# Patient Record
Sex: Male | Born: 1975
Health system: Southern US, Community
[De-identification: ages and names within clinical notes are randomized; demographics above are authoritative.]

## PROBLEM LIST (undated history)

## (undated) DIAGNOSIS — R5383 Other fatigue: Secondary | ICD-10-CM

## (undated) DIAGNOSIS — Z8489 Family history of other specified conditions: Secondary | ICD-10-CM

## (undated) DIAGNOSIS — R590 Localized enlarged lymph nodes: Secondary | ICD-10-CM

## (undated) DIAGNOSIS — M791 Myalgia, unspecified site: Secondary | ICD-10-CM

## (undated) DIAGNOSIS — R569 Unspecified convulsions: Secondary | ICD-10-CM

## (undated) DIAGNOSIS — K219 Gastro-esophageal reflux disease without esophagitis: Secondary | ICD-10-CM

## (undated) DIAGNOSIS — R1030 Lower abdominal pain, unspecified: Secondary | ICD-10-CM

## (undated) DIAGNOSIS — L0390 Cellulitis, unspecified: Secondary | ICD-10-CM

## (undated) DIAGNOSIS — K279 Peptic ulcer, site unspecified, unspecified as acute or chronic, without hemorrhage or perforation: Secondary | ICD-10-CM

## (undated) DIAGNOSIS — K649 Unspecified hemorrhoids: Secondary | ICD-10-CM

## (undated) DIAGNOSIS — F79 Unspecified intellectual disabilities: Secondary | ICD-10-CM

## (undated) DIAGNOSIS — F70 Mild intellectual disabilities: Secondary | ICD-10-CM

## (undated) HISTORY — DX: Localized enlarged lymph nodes: R59.0

## (undated) HISTORY — DX: Cellulitis, unspecified: L03.90

## (undated) HISTORY — PX: HERNIA REPAIR: SHX51

## (undated) HISTORY — DX: Unspecified convulsions: R56.9

## (undated) HISTORY — DX: Unspecified hemorrhoids: K64.9

## (undated) HISTORY — DX: Gastro-esophageal reflux disease without esophagitis: K21.9

## (undated) HISTORY — DX: Peptic ulcer, site unspecified, unspecified as acute or chronic, without hemorrhage or perforation: K27.9

## (undated) HISTORY — DX: Myalgia, unspecified site: M79.10

## (undated) HISTORY — DX: Unspecified intellectual disabilities: F79

## (undated) HISTORY — DX: Other fatigue: R53.83

## (undated) HISTORY — DX: Mild intellectual disabilities: F70

## (undated) HISTORY — DX: Lower abdominal pain, unspecified: R10.30

---

## 1998-04-26 ENCOUNTER — Emergency Department (HOSPITAL_COMMUNITY): Admission: EM | Admit: 1998-04-26 | Discharge: 1998-04-26 | Payer: Self-pay | Admitting: *Deleted

## 1998-05-02 ENCOUNTER — Ambulatory Visit (HOSPITAL_COMMUNITY): Admission: RE | Admit: 1998-05-02 | Discharge: 1998-05-02 | Payer: Self-pay | Admitting: Emergency Medicine

## 2000-09-09 ENCOUNTER — Emergency Department (HOSPITAL_COMMUNITY): Admission: EM | Admit: 2000-09-09 | Discharge: 2000-09-09 | Payer: Self-pay | Admitting: Emergency Medicine

## 2001-05-13 ENCOUNTER — Emergency Department (HOSPITAL_COMMUNITY): Admission: EM | Admit: 2001-05-13 | Discharge: 2001-05-13 | Payer: Self-pay | Admitting: Emergency Medicine

## 2001-06-15 ENCOUNTER — Inpatient Hospital Stay (HOSPITAL_COMMUNITY): Admission: EM | Admit: 2001-06-15 | Discharge: 2001-06-22 | Payer: Self-pay

## 2001-06-21 ENCOUNTER — Encounter: Payer: Self-pay | Admitting: Internal Medicine

## 2001-06-29 ENCOUNTER — Encounter: Admission: RE | Admit: 2001-06-29 | Discharge: 2001-06-29 | Payer: Self-pay | Admitting: Internal Medicine

## 2001-10-22 ENCOUNTER — Encounter: Payer: Self-pay | Admitting: Emergency Medicine

## 2001-10-22 ENCOUNTER — Emergency Department (HOSPITAL_COMMUNITY): Admission: EM | Admit: 2001-10-22 | Discharge: 2001-10-22 | Payer: Self-pay | Admitting: Emergency Medicine

## 2002-05-07 ENCOUNTER — Encounter: Admission: RE | Admit: 2002-05-07 | Discharge: 2002-05-07 | Payer: Self-pay | Admitting: Internal Medicine

## 2002-08-02 ENCOUNTER — Emergency Department (HOSPITAL_COMMUNITY): Admission: EM | Admit: 2002-08-02 | Discharge: 2002-08-02 | Payer: Self-pay | Admitting: Emergency Medicine

## 2002-08-17 ENCOUNTER — Encounter: Admission: RE | Admit: 2002-08-17 | Discharge: 2002-08-17 | Payer: Self-pay | Admitting: Internal Medicine

## 2002-08-30 ENCOUNTER — Encounter: Admission: RE | Admit: 2002-08-30 | Discharge: 2002-08-30 | Payer: Self-pay | Admitting: Internal Medicine

## 2002-09-24 ENCOUNTER — Emergency Department (HOSPITAL_COMMUNITY): Admission: EM | Admit: 2002-09-24 | Discharge: 2002-09-25 | Payer: Self-pay | Admitting: Emergency Medicine

## 2003-03-07 ENCOUNTER — Emergency Department (HOSPITAL_COMMUNITY): Admission: EM | Admit: 2003-03-07 | Discharge: 2003-03-07 | Payer: Self-pay | Admitting: Emergency Medicine

## 2003-05-31 ENCOUNTER — Encounter: Admission: RE | Admit: 2003-05-31 | Discharge: 2003-05-31 | Payer: Self-pay | Admitting: Internal Medicine

## 2003-10-10 ENCOUNTER — Encounter: Admission: RE | Admit: 2003-10-10 | Discharge: 2003-10-10 | Payer: Self-pay | Admitting: Internal Medicine

## 2004-10-29 ENCOUNTER — Ambulatory Visit: Payer: Self-pay | Admitting: Internal Medicine

## 2004-11-13 ENCOUNTER — Ambulatory Visit: Payer: Self-pay | Admitting: Internal Medicine

## 2006-06-08 ENCOUNTER — Ambulatory Visit: Payer: Self-pay | Admitting: Internal Medicine

## 2006-06-08 ENCOUNTER — Encounter (INDEPENDENT_AMBULATORY_CARE_PROVIDER_SITE_OTHER): Payer: Self-pay | Admitting: Internal Medicine

## 2006-06-08 LAB — CONVERTED CEMR LAB
Anion Gap: 9
BUN: 11 mg/dL
CO2: 27 meq/L
Glucose, Bld: 92 mg/dL
Potassium: 4 meq/L
TSH: 0.367 microintl units/mL

## 2006-09-05 ENCOUNTER — Ambulatory Visit: Payer: Self-pay | Admitting: Hospitalist

## 2006-10-25 ENCOUNTER — Encounter (INDEPENDENT_AMBULATORY_CARE_PROVIDER_SITE_OTHER): Payer: Self-pay | Admitting: Internal Medicine

## 2006-10-25 DIAGNOSIS — Z8711 Personal history of peptic ulcer disease: Secondary | ICD-10-CM

## 2006-10-25 DIAGNOSIS — K219 Gastro-esophageal reflux disease without esophagitis: Secondary | ICD-10-CM | POA: Insufficient documentation

## 2006-10-25 DIAGNOSIS — K649 Unspecified hemorrhoids: Secondary | ICD-10-CM | POA: Insufficient documentation

## 2006-10-25 DIAGNOSIS — K279 Peptic ulcer, site unspecified, unspecified as acute or chronic, without hemorrhage or perforation: Secondary | ICD-10-CM

## 2006-10-25 DIAGNOSIS — F70 Mild intellectual disabilities: Secondary | ICD-10-CM | POA: Insufficient documentation

## 2006-10-25 HISTORY — DX: Peptic ulcer, site unspecified, unspecified as acute or chronic, without hemorrhage or perforation: K27.9

## 2007-06-17 ENCOUNTER — Ambulatory Visit: Payer: Self-pay | Admitting: Infectious Diseases

## 2007-06-17 ENCOUNTER — Inpatient Hospital Stay (HOSPITAL_COMMUNITY): Admission: EM | Admit: 2007-06-17 | Discharge: 2007-06-20 | Payer: Self-pay | Admitting: Emergency Medicine

## 2007-06-17 ENCOUNTER — Ambulatory Visit: Payer: Self-pay | Admitting: Vascular Surgery

## 2007-06-17 ENCOUNTER — Encounter (INDEPENDENT_AMBULATORY_CARE_PROVIDER_SITE_OTHER): Payer: Self-pay | Admitting: Emergency Medicine

## 2007-06-20 ENCOUNTER — Other Ambulatory Visit: Payer: Self-pay | Admitting: Family Medicine

## 2007-06-20 ENCOUNTER — Encounter: Payer: Self-pay | Admitting: Internal Medicine

## 2007-06-27 ENCOUNTER — Encounter (INDEPENDENT_AMBULATORY_CARE_PROVIDER_SITE_OTHER): Payer: Self-pay | Admitting: Internal Medicine

## 2007-06-27 ENCOUNTER — Ambulatory Visit: Payer: Self-pay | Admitting: Internal Medicine

## 2007-08-29 ENCOUNTER — Ambulatory Visit: Payer: Self-pay | Admitting: Internal Medicine

## 2007-09-28 ENCOUNTER — Ambulatory Visit: Payer: Self-pay | Admitting: Internal Medicine

## 2007-09-29 ENCOUNTER — Ambulatory Visit (HOSPITAL_COMMUNITY): Admission: RE | Admit: 2007-09-29 | Discharge: 2007-09-29 | Payer: Self-pay | Admitting: Internal Medicine

## 2007-09-29 ENCOUNTER — Ambulatory Visit: Payer: Self-pay | Admitting: Vascular Surgery

## 2007-10-17 ENCOUNTER — Telehealth (INDEPENDENT_AMBULATORY_CARE_PROVIDER_SITE_OTHER): Payer: Self-pay | Admitting: Internal Medicine

## 2007-10-31 ENCOUNTER — Encounter (INDEPENDENT_AMBULATORY_CARE_PROVIDER_SITE_OTHER): Payer: Self-pay | Admitting: Internal Medicine

## 2008-08-02 ENCOUNTER — Ambulatory Visit: Payer: Self-pay | Admitting: Internal Medicine

## 2008-08-12 ENCOUNTER — Encounter (INDEPENDENT_AMBULATORY_CARE_PROVIDER_SITE_OTHER): Payer: Self-pay | Admitting: Emergency Medicine

## 2008-08-12 ENCOUNTER — Ambulatory Visit: Payer: Self-pay | Admitting: Vascular Surgery

## 2008-08-12 ENCOUNTER — Emergency Department (HOSPITAL_COMMUNITY): Admission: EM | Admit: 2008-08-12 | Discharge: 2008-08-12 | Payer: Self-pay | Admitting: Emergency Medicine

## 2008-08-14 ENCOUNTER — Emergency Department (HOSPITAL_COMMUNITY): Admission: EM | Admit: 2008-08-14 | Discharge: 2008-08-14 | Payer: Self-pay | Admitting: Emergency Medicine

## 2008-08-16 ENCOUNTER — Emergency Department (HOSPITAL_COMMUNITY): Admission: EM | Admit: 2008-08-16 | Discharge: 2008-08-16 | Payer: Self-pay | Admitting: Emergency Medicine

## 2008-08-20 ENCOUNTER — Encounter (INDEPENDENT_AMBULATORY_CARE_PROVIDER_SITE_OTHER): Payer: Self-pay | Admitting: Internal Medicine

## 2008-08-20 ENCOUNTER — Inpatient Hospital Stay (HOSPITAL_COMMUNITY): Admission: AD | Admit: 2008-08-20 | Discharge: 2008-08-23 | Payer: Self-pay | Admitting: Internal Medicine

## 2008-08-20 ENCOUNTER — Ambulatory Visit: Payer: Self-pay | Admitting: Internal Medicine

## 2009-04-11 ENCOUNTER — Emergency Department (HOSPITAL_COMMUNITY): Admission: EM | Admit: 2009-04-11 | Discharge: 2009-04-11 | Payer: Self-pay | Admitting: Emergency Medicine

## 2009-04-12 ENCOUNTER — Emergency Department (HOSPITAL_COMMUNITY): Admission: EM | Admit: 2009-04-12 | Discharge: 2009-04-12 | Payer: Self-pay | Admitting: Emergency Medicine

## 2009-04-15 ENCOUNTER — Emergency Department (HOSPITAL_COMMUNITY): Admission: EM | Admit: 2009-04-15 | Discharge: 2009-04-16 | Payer: Self-pay | Admitting: Emergency Medicine

## 2009-04-17 ENCOUNTER — Encounter (INDEPENDENT_AMBULATORY_CARE_PROVIDER_SITE_OTHER): Payer: Self-pay | Admitting: Internal Medicine

## 2009-04-17 ENCOUNTER — Ambulatory Visit: Payer: Self-pay | Admitting: Internal Medicine

## 2009-04-18 ENCOUNTER — Encounter: Payer: Self-pay | Admitting: Internal Medicine

## 2009-04-28 ENCOUNTER — Ambulatory Visit: Payer: Self-pay | Admitting: Internal Medicine

## 2009-09-02 ENCOUNTER — Ambulatory Visit: Payer: Self-pay | Admitting: Internal Medicine

## 2010-06-18 DIAGNOSIS — R5383 Other fatigue: Secondary | ICD-10-CM

## 2010-06-18 HISTORY — DX: Other fatigue: R53.83

## 2010-07-03 ENCOUNTER — Ambulatory Visit: Payer: Self-pay | Admitting: Internal Medicine

## 2010-07-03 ENCOUNTER — Encounter: Payer: Self-pay | Admitting: Internal Medicine

## 2010-07-03 DIAGNOSIS — R5383 Other fatigue: Secondary | ICD-10-CM

## 2010-07-03 DIAGNOSIS — R569 Unspecified convulsions: Secondary | ICD-10-CM

## 2010-07-03 DIAGNOSIS — R1031 Right lower quadrant pain: Secondary | ICD-10-CM

## 2010-07-03 DIAGNOSIS — R5381 Other malaise: Secondary | ICD-10-CM

## 2010-07-08 ENCOUNTER — Encounter: Payer: Self-pay | Admitting: Internal Medicine

## 2010-07-08 ENCOUNTER — Ambulatory Visit: Payer: Self-pay | Admitting: Internal Medicine

## 2010-07-08 LAB — CONVERTED CEMR LAB
Albumin: 4.5 g/dL
BUN: 9 mg/dL
CO2: 25 meq/L
Creatinine, Ser: 0.89 mg/dL
Glucose, Bld: 89 mg/dL
HCT: 44.3 %
HDL: 67 mg/dL
LDL Cholesterol: 100 mg/dL
MCV: 86.7 fL
Monocytes Absolute: 0.4 10*3/uL
Monocytes Relative: 13 %
Platelets: 252 10*3/uL
Potassium: 4 meq/L
RDW: 13.4 %
TSH: 0.443 microintl units/mL
VLDL: 10 mg/dL
WBC: 3.3 10*3/uL

## 2010-07-10 ENCOUNTER — Encounter: Payer: Self-pay | Admitting: Internal Medicine

## 2010-07-17 ENCOUNTER — Ambulatory Visit: Payer: Self-pay | Admitting: Internal Medicine

## 2010-07-20 LAB — CONVERTED CEMR LAB
ALT: 15 units/L (ref 0–53)
Alkaline Phosphatase: 37 units/L — ABNORMAL LOW (ref 39–117)
CO2: 25 meq/L (ref 19–32)
Calcium: 9 mg/dL (ref 8.4–10.5)
Chloride: 103 meq/L (ref 96–112)
Cholesterol: 177 mg/dL (ref 0–200)
Eosinophils Absolute: 0.2 10*3/uL (ref 0.0–0.7)
HCT: 44.3 % (ref 39.0–52.0)
HDL: 67 mg/dL (ref >39)
Hemoglobin: 14.1 g/dL (ref 13.0–17.0)
LDL Cholesterol: 100 mg/dL — ABNORMAL HIGH (ref 0–99)
Lymphocytes Relative: 21 % (ref 12–46)
Lymphs Abs: 0.7 10*3/uL (ref 0.7–4.0)
MCV: 86.7 fL (ref >=78.0)
Monocytes Absolute: 0.4 10*3/uL (ref 0.1–1.0)
Neutro Abs: 2 10*3/uL (ref 1.7–7.7)
Platelets: 252 10*3/uL (ref 150–400)
Sodium: 138 meq/L (ref 135–145)
TSH: 0.443 microintl units/mL (ref 0.350–4.5)
Total CHOL/HDL Ratio: 2.6
VLDL: 10 mg/dL (ref 0–40)

## 2010-07-21 ENCOUNTER — Telehealth: Payer: Self-pay | Admitting: *Deleted

## 2010-11-19 NOTE — Assessment & Plan Note (Signed)
Summary: Daytime sleepiness, groin pain, [employer] paperwork   Vital Signs:  Patient profile:   35 year old male Height:      67 inches (170.18 cm) Weight:      140.0 pounds (63.64 kg) BMI:     22.01 Temp:     97.3 degrees F (36.28 degrees C) oral Pulse rate:   60 / minute BP sitting:   134 / 89  (left arm) Cuff size:   regular  Vitals Entered By: Theotis Barrio NT II (July 03, 2010 10:38 AM) CC: MOTHER WANTS  HIM TESTED  FOR DM  /  GROIN PAIN /  SLEEP A LOT Pain Assessment Patient in pain? yes     Location: GROIN Intensity: 9 Onset of pain  ON / OFF FOR YEARS   Nutritional Status BMI of 19 -24 = normal  Have you ever been in a relationship where you felt threatened, hurt or afraid?No   Does patient need assistance? Functional Status Self care Ambulation Normal Comments SLEEP A LOT / MOM WANTS HIM CHEKCKED FOR DM  / FLU SHOT   Primary Care Provider:  Deatra Robinson MD  CC:  MOTHER WANTS  HIM TESTED  FOR DM  /  GROIN PAIN /  SLEEP A LOT.  History of Present Illness: Pt is a 35 yo male with PMHx of mild mental retardation, GERD, h/o RLE cellulitis who presents to clinic today with multiple medical concerns, and is accompanied by his mother who helps with obtaining history. Concerns as follows:   1) Groin pain - intermittent in nature, happening every couple of days, for the past "several years". Described as a sharp pain, that occurs mostly at night when pt lays down to go to bed. In prior workup has had inguinal lymphadenopathy noted since 2009, with aspiration culture taken from a right groin lymph node during hospitalization 06/2008 that was negative. Mom confirms hx of inguinal hernias surgically corrected at age 65 years old. No prior sexual encounters, no penile lesions, no dysuria, no trauma, no rashes.   2) Increased sleepiness at least over for past 3 weeks, "maybe longer" - Mom indicates that Arber is taking daily nap and is at times falling asleep at  church. He states he is sleeping well 4 nights a week, and the other 3 nights is awakening after 1 hour of sleep, unknown precipitant.  Nightly schedule described as showes, eats, watches tv, then goes to bed. Denies recent illness, no recent sick contacts, no snoring.  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 He feels he is strong enough and without physical limitation to participate again this year. Vision and hearing are without problem, per pt.        Preventive Screening-Counseling & Management  Alcohol-Tobacco     Smoking Status: never  Caffeine-Diet-Exercise     Does Patient Exercise: yes     Type of exercise: BASKET BALL / SOFT BALL  Current Medications (verified): 1)  None  Allergies: No Known Drug Allergies  Family History: - 1 brother, 2 sisters - 1/2 brother (same father as pt) who died of complications from sickle cell anemia  last year at age 18. MGM - DM, ESRD requiring Mother - sleep apnea  Review of Systems       Per HPI  Physical Exam  General:  Well-developed,well-nourished,in no acute distress; alert,appropriate and cooperative throughout examination Head:  Normocephalic and atraumatic without obvious abnormalities. No apparent alopecia or balding. Eyes:  No corneal or conjunctival inflammation noted. EOMI. Perrla. Vision grossly normal. Mouth:  Oral mucosa and oropharynx without lesions or exudates.  Teeth in good repair. Neck:  No deformities, masses, or tenderness noted. Lungs:  Normal respiratory effort, chest expands symmetrically. Lungs are clear to auscultation, no crackles or wheezes. Heart:  Normal rate and regular rhythm. S1 and S2 normal without gallop, murmur, click, rub or other extra sounds. Abdomen:  Bowel sounds positive,abdomen soft and non-tender without masses, organomegaly or hernias noted. Genitalia:  circumcised,  no varicocele, no scrotal masses, no testicular masses or atrophy, no cutaneous lesions, and no urethral discharge.  No inguinal hernias appreciated.  Msk:  normal ROM, no joint tenderness, and no joint swelling.   Pulses:  R dorsalis pedis normal and L dorsalis pedis normal.   Neurologic:  alert & oriented X3, cranial nerves II-XII intact, and strength normal in all extremities.   Inguinal Nodes:  BL shotty inguinal lymphadenopathy. All palpated lymph nodes less than 1 cm.   Impression & Recommendations:  Problem # 1:  FATIGUE, ACUTE (ICD-780.79) Increased daytime sleepiness may be resultant of viral illness (given acute onset), versus poor sleep hygiene, versus metabolic disturbance such as thyroid disease, versus anemia. Sleep apnea less likely given slender body habitus, no sleeping, sleep disturbance only occuring intermittently. Does not endorse anxiety or depressive symptoms today. - Will check labs today - including TSH, CMP, CBC - Will discuss sleep hygeine practices with Zain and mother - Will encourage exercise   Future Orders: T-Comprehensive Metabolic Panel [phone]) ... 07/06/2010 T-TSH [id number]) ... 07/06/2010  Problem # 2:  GROIN PAIN (ICD-789.09) Ongoing for past "several years", intermittent in nature. Etiology unclear at this time.  - No evidence of infection at this time (no discharge, lesions, rash, skin infection)  - Shotty BL inguinal lymphadenopathy appreciated on exam. During hospitalization in 2009, patient also described this groin pain, at which time a R LN aspiration culture was collected and found to be negative - it is not uncommon for shotty lymphadenopathy to be appreciated on an adult man, especially of lean stature. - No evidence of inguinal hernia on exam - also pain is not coming on during episodes of increased intraabdominal pressure such as with bearing down, coughing, or sneezing, as would be expected with an inguinal hernia - Will continue to  follow, with yearly exams to assess if any progression of size of lymph nodes - can consider biopsy in future if progression in size appreciated.   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  - FLP due --> pt will return fasting on 9/19 - Filled out [employer] paperwork --> to be picked up 9/19 - Pt due for Tdap --> plan to administer 9/19   Future Orders: T-Lipid Profile [phone]) ... 07/06/2010  Other Orders: Admin 1st Vaccine ([id number]) Flu Vaccine 67yrs + [phone]) Future Orders: T-CBC w/Diff ([id number]) ... 07/06/2010  Patient Instructions: 1)  Please follow-up with your Primary care provider in 6months, at which time we can reevaluate your sleepiness and groin pain. 2)  We are ordering labs, to be completed on Monday. Please be fasting for these labs (no eating/ drinking after midnight the night before). I will call you if results are abnormal. 3)  I will leave your [employer] paperwork at the front desk  for you to pick up Monday. 4)  Please follow the following: 5)  - Sleep only as much as necessary to feel rested 6)  - Maintain a regular sleep schedule 7)  - Do not force sleep 8)  - Avoid caffeine after lunch 9)  - Adjust bedroom environment - light, noise, temperature 10)  - Deal with concerns or worries before bedtime 11)  - Exercise regularly. Process Orders Check Orders Results:     [facility]: ABN not required for this insurance Order queued for requisitioning for [facility]: July 06, 2010 10:26 AM  Tests Sent for requisitioning (July 06, 2010 11:41 AM):     07/06/2010: [facility] -- T-Comprehensive Metabolic Panel [[id number]] (signed)     07/06/2010: [facility] -- T-CBC w/Diff [[id number]] (signed)     07/06/2010: [facility] -- T-TSH [phone]  (signed)     07/06/2010: [facility] -- T-Lipid Profile [phone] (signed)     Prevention & Chronic Care Immunizations   Influenza vaccine: Fluvax 3+  (07/03/2010)    Tetanus booster: Not documented    Pneumococcal vaccine: Not documented  Other Screening   Smoking status: never  (07/03/2010)  Lipids   Total Cholesterol: Not documented   LDL: Not documented   LDL Direct: Not documented   HDL: Not documented   Triglycerides: Not documented Flu Vaccine Consent Questions     Do you have a history of severe allergic reactions to this vaccine? no    Any prior history of allergic reactions to egg and/or gelatin? no    Do you have a sensitivity to the preservative Thimersol? no    Do you have a past history of Guillan-Barre Syndrome? no    Do you currently have an acute febrile illness? no    Have you ever had a severe reaction to latex? no    Vaccine information given and explained to patient? yes    Are you currently pregnant? no    Lot Number:[id number]   Exp Date:04/17/2011   Manufacturer: [employer]    Site Given  Left Deltoid IM.Cynda Familia [facility])  July 03, 2010 2:47 PM    LDL Direct: Not documented   HDL: Not documented   Triglycerides: Not documented  .opcflu  Appended Document: Daytime sleepiness, groin pain, [employer] paperwork Plan check repeat CBC and free T4 next visit. Johnette Abraham, D.O.

## 2010-11-19 NOTE — Progress Notes (Signed)
----   Converted from flag ---- ---- 07/21/2010 12:07 PM, Johnette Abraham DO wrote: Thank you!! Sincerely, Johnette Abraham, D.O.   ---- 07/21/2010 10:57 AM, Chinita Pester RN wrote: I called and talked to pt.'s mother about his low blood count and Dr. Thad Ranger wants pt.to make an appt. in 4  weeks.  Mother agreed;appt.has been scheduled Nov. 15, 20114 w/Dr.Karimova.  ---- 07/21/2010 8:46 AM, Jeanie Cooks Sturdivant NT II wrote: HI GLENDA ! ME AGAIN CAN YOU TAKE CARE OF THIS FOR ME? THANKS LELA  ---- 07/20/2010 4:52 PM, Johnette Abraham DO wrote: Dorothy Puffer, can you please call Mr. Dimattia (or his mother) and let them know that his lab work looked ok, except a low blood cell count, which seems to be chronic for Mio, with similar lab values from 2002. However, just because he was symptomatic, I would like to see him back in the clinic in 4 weeks for repeat CBC with diff and reevaluation of his inguinal lymphadenopathy and sleepiness.   Thank you!!  Sincerely, Johnette Abraham, D.O. ------------------------------

## 2010-11-19 NOTE — Letter (Signed)
Summary: [facility]  [facility]   Imported By: Margie Billet 07/09/2010 15:45:10  _____________________________________________________________________  External Attachment:    Type:   Image     Comment:   External Document

## 2010-11-19 NOTE — Miscellaneous (Signed)
Summary: Update of Labs  Clinical Lists Changes  Observations: Added new observation of TSH: 0.443 microintl units/mL (07/08/2010 10:03) Added new observation of VLDL: 10 mg/dL (04/54/0981 19:14) Added new observation of LDL: 100 mg/dL (78/29/5621 30:86) Added new observation of HDL: 67 mg/dL (57/84/6962 95:28) Added new observation of TRIGLYC TOT: 48 mg/dL (41/32/4401 02:72) Added new observation of CHOLESTEROL: 177 mg/dL (53/66/4403 47:42) Added new observation of CALCIUM: 9 mg/dL (59/56/3875 64:33) Added new observation of ALBUMIN: 4.5 g/dL (29/51/8841 66:06) Added new observation of PROTEIN, TOT: 7 g/dL (30/16/0109 32:35) Added new observation of SGPT (ALT): 15 units/L (07/08/2010 10:03) Added new observation of SGOT (AST): 19 units/L (07/08/2010 10:03) Added new observation of ALK PHOS: 37 units/L (07/08/2010 10:03) Added new observation of CREATININE: 0.89 mg/dL (57/32/2025 42:70) Added new observation of BUN: 9 mg/dL (62/37/6283 15:17) Added new observation of BG RANDOM: 89 mg/dL (61/60/7371 06:26) Added new observation of CO2 PLSM/SER: 25 meq/L (07/08/2010 10:03) Added new observation of CL SERUM: 103 meq/L (07/08/2010 10:03) Added new observation of K SERUM: 4 meq/L (07/08/2010 10:03) Added new observation of NA: 138 meq/L (07/08/2010 10:03) Added new observation of ABSOLUTE BAS: 0 K/uL (07/08/2010 10:03) Added new observation of BASOPHIL %: 0 % (07/08/2010 10:03) Added new observation of EOS ABSLT: 0.2 K/uL (07/08/2010 10:03) Added new observation of % EOS AUTO: 5 % (07/08/2010 10:03) Added new observation of ABSOLUTE MON: 0.4 K/uL (07/08/2010 10:03) Added new observation of MONOCYTE %: 13 % (07/08/2010 10:03) Added new observation of ABS LYMPHOCY: 0.7 K/uL (07/08/2010 10:03) Added new observation of LYMPHS %: 21 % (07/08/2010 10:03) Added new observation of PLATELETK/UL: 252 K/uL (07/08/2010 10:03) Added new observation of RDW: 13.4 % (07/08/2010 10:03) Added new  observation of MCHC RBC: 31.8 g/dL (94/85/4627 03:50) Added new observation of MCV: 86.7 fL (07/08/2010 10:03) Added new observation of HCT: 44.3 % (07/08/2010 10:03) Added new observation of HGB: 14.1 g/dL (09/38/1829 93:71) Added new observation of RBC M/UL: 5.1 M/uL (07/08/2010 10:03) Added new observation of WBC COUNT: 3.3 10*3/microliter (07/08/2010 10:03)

## 2010-11-19 NOTE — Assessment & Plan Note (Signed)
Summary: Flu injection    Immunizations Administered:  Tetanus Vaccine:    Vaccine Type: Tdap    Site: right deltoid    Mfr: GlaxoSmithKline    Dose: 0.5 ml    Route: IM    Given by: Angelina Ok RN    Exp. Date: 07/08/2012    Lot #: [id number]    VIS given: 09/04/08 version given July 17, 2010. Given 07/09/2011

## 2011-02-02 ENCOUNTER — Encounter: Payer: Self-pay | Admitting: Internal Medicine

## 2011-03-02 NOTE — Op Note (Signed)
Robert Merritt, Robert Merritt               ACCOUNT NO.:  [id number]   MEDICAL RECORD NO.:  [id number]          PATIENT TYPE:  INP   LOCATION:  5532                         FACILITY:  [facility]   PHYSICIAN:  Almedia Balls. Ranell Patrick, M.D. DATE OF BIRTH:  Jun 15, 1976   DATE OF PROCEDURE:  08/20/2008  DATE OF DISCHARGE:                               OPERATIVE REPORT   PREOPERATIVE DIAGNOSES:  1. Right leg abscess.  2. Right foot abscess.   POSTOPERATIVE DIAGNOSES:  1. Right leg abscess.  2. Right foot abscess.   PROCEDURES PERFORMED:  1. Incision and drainage, right foot.  2. Incision and drainage, right leg with intraoperative cultures from      both locations.   ATTENDING SURGEON:  Almedia Balls. Ranell Patrick, MD   ASSISTANT:  None.   General anesthesia was used.   ESTIMATED BLOOD LOSS:  Minimal.   TOURNIQUET TIME:  30 minutes with 300 mL of mercury.   INSTRUMENT COUNT:  Correct.   COMPLICATIONS:  None.   Preoperative antibiotics were given.  The patient is on preoperative  vancomycin.   INDICATIONS:  The patient is a 35 year old male with a history of right  leg infection dating back to 2008.  The patient has reported last  several days increasing swelling and pain, and a draining wound from his  right calf and foot.  The patient has been seen by the emergency  department on two separate occasions prior to this admission, the  patient is now admitted for progressive cellulitis and potential for  abscess.  Orthopedics is consulted to tentatively treat the abscess.  I  discussed with the patient and his family that there is clearly an  abscess on physical examination and the patient does need operative  drainage and this was agreed too and consent was signed.   DESCRIPTION OF SURGERY:  After adequate level of anesthesia was  achieved, the patient was positioned in supine on the operating room  table.  A bump was placed on the right hip.  A nonsterile tourniquet was  placed on right proximal  thigh.  Right leg sterilely prepped and draped  in usual manner after elevation for 5 minutes.  We elevated the  tourniquet to 300 mmHg.  We then made a longitudinal skin incision  directly overlying a large area of subcutaneous abscess over the dorsal  lateral foot.  Pus exploded out of the wound.  This was flushed under  pressure.  This, what seemed to be relegated to the dorsal aspect of  foot did not seem to go down through the intermetatarsal space.  A sharp  debridement was performed on any nonviable appearing tissue.  This was  about an 8-cm incision that was created over this area.  We sent  intraoperative fluid cultures, aerobic, anaerobic, stat Gram-stain, and  then pulse irrigated with 3 L of pulse irrigation and normal saline  irrigation.  Remainder of the wound looked nice and clean and again  there was no evidence of this progressing any deeper.  It seemed to be  superficial.   Next, we went up to the patient's leg  wound, made a separate  longitudinal incision, and it ellipsed out the patient's prior draining  wound.  This actually opened up and was fairly extensive deeper wound  area.  This was about an 8-cm incision as well, and all nonviable tissue  was removed.  We did send some for tissue culture both aerobic,  anaerobic and Gram stain, and then did a sharp debridement of all  nonviable tissue and pulse irrigation using 3 L of normal saline, pulse  irrigation, remaining wound looked nice and clean.  We packed both  wounds with diluted Betadine, Kerlix gauze and then a sterile  compressive dry dressing followed by a short leg splint.  The patient  was taken to recovery room, extubated having tolerated surgery well.      Almedia Balls. Ranell Patrick, M.D.  Electronically Signed     SRN/MEDQ  D:  08/20/2008  T:  08/21/2008  Job:  [id number]

## 2011-03-02 NOTE — Discharge Summary (Signed)
Robert Merritt, Robert Merritt               ACCOUNT NO.:  [id number]   MEDICAL RECORD NO.:  [id number]          PATIENT TYPE:  INP   LOCATION:  5532                         FACILITY:  [facility]   PHYSICIAN:  Robert Merritt, D.O.  DATE OF BIRTH:  30-Oct-1975   DATE OF ADMISSION:  08/20/2008  DATE OF DISCHARGE:  08/23/2008                               DISCHARGE SUMMARY   PRIORITY DISCHARGE SUMMARY   DISCHARGE DIAGNOSES:  1. Right lower extremity cellulitis and abscess s/p I&D this      hospitalization - likely secondary to contact exposure (mother with      a recent methicillin-resistant Staphylococus aureus infection),      resolving on discharge.  2. Gastroesophageal reflux disease - chronic, controlled with Pepcid      treatment.  3. History of peptic ulcer disease.  4. Mild mental retardation.  5. History of seizure disorder - last seizure in 1995, patient not on      any medications since 1995.   DISCHARGE MEDICATIONS:  1. Pepcid 20 mg tablets, take 1 tablet once daily.  2. Doxycycline 100 mg tablets, take 1 tablet twice daily for 14 days.  3. Percocet 5/325 mg, take 1 tablet every 4 to 6 hours as needed for      pain.  4. Lotrimin cream, apply to area of foot and leg twice a day.   DISPOSITION AND FOLLOWUP:  The patient will follow up in outpatient  clinic with Dr. Aleene Davidson on September 04, 2008.  Please evaluate if  patient has completed course of antibiotics and if cellulitis on the  right leg and foot has resolved.  During the admission and discharge,  there were no electrolyte abnormalities present, and CBC was within  normal limits and no need to check on followup.  Patient will also  follow up with orthopedic surgeon, Dr. Ranell Patrick, in 2 weeks, and we will  call patient when appointment is scheduled.   PROCEDURES PERFORMED:  1. November 3rd:  X-ray of the tibia and fibula showed soft tissue      swelling and superficial ulceration along the lateral aspect of the      lower leg.   No evidence of osteomyelitis or other bone      abnormalities.  2. November 3rd:  X-ray of the foot showed a nondisplaced fracture of      the distal phalanx of the lower toe, no evidence of osteomyelitis.  3. November 3rd:  X-ray of the ankle showed diffuse ankle soft tissue      swelling and no bony abnormalities identified.   CONSULTATIONS:  Almedia Balls. Ranell Patrick, MD, Ortho.   HISTORY OF PRESENT ILLNESS:  Patient is a 35 year old male with past  medical history of seizures, the last episode in 1995, MRSA-positive  cellulitis in right lower extremity in 2008, who presented to [facility] for right leg swelling that started about 15 days ago and has  been getting progressively worse.  He was seen in ED 3 days ago and was  also seen in ED 1 week ago.  He was placed on clindamycin, but  the  treatment did not work for him and now he has 2 prominent abscesses on  lateral aspect of the right foot and ulceration on the right leg.  He  denies systemic symptoms of fever, chills, myalgias, weakness, numbness,  no abdominal or urinary complaints, no nausea, vomiting, or diarrhea.  He denies recent traveling, history of hiking, no trauma, no history of  surgeries of knees.  He does report his mom having an MRSA infection in  the recent month.   VITAL SIGNS ON ADMISSION:  Temperature 98.1, blood pressure 141/95,  pulse 87, respirations 18, saturating 99% on room air.   PHYSICAL EXAMINATION:  The patient has significant right lower extremity  pitting edema, a 3 cm round abscess on lateral aspect of the foot, and  ulceration on lateral aspect of the lower extremity (ulceration midline  between knee and the ankle), associated with erythema, tender to  palpation, warm to touch, some yellow pus drained from right leg, no pus  drainage from right foot abscess.  Strength 5 out of 5 throughout  bilaterally.  No focal neurologic deficits.   LABORATORY DATA:  Sodium 135, potassium 4.1, chloride 100,  bicarb 27,  BUN 9, creatinine 0.87, glucose 91, calcium 9.5.  White blood cells 7.9,  AST 5.8, hemoglobin 13.6, MCV 84, platelets 591.   HOSPITAL COURSE BY PROBLEM:  1. Right lower extremity cellulitis with right foot abscess - this was      likely secondary to exposure (mother with recent MRSA-positive      infection on abdomen).  We collected wound cultures of the right      foot as well as tissue cultures of the right leg, and both were      positive for MRSA.  Patient was placed on contact precaution,      Orthopedics was consulted, and Dr. Ranell Patrick performed an I and D the      same day of admission.  Patient did well postop, he recovered well,      and vancomycin and Unasyn antibiotics were continued.  Patient      responded well with treatment, continued to stay afebrile, and no      leukocytosis on labs.  The patient was discharged on doxycycline      for 14 more days (based on wound cultures, based on sensitivity      report).  Blood cultures were negative x2 and patient was given a      rolling walker on discharge.  2. GERD - patient had some abdominal discomfort during the      hospitalization.  We continued his home medication, Pepcid, and on      discharge he had no abdominal pain.   DISCHARGE VITAL SIGNS AND LABORATORY DATA:  VITAL SIGNS:  Temperature  98.2, pulse 80, respirations 18, blood pressure 127/82, saturating 97%  on room air.  LABS:  Sodium 136, potassium 3.8, chloride 105, bicarb 26, glucose 98,  BUN 8, creatinine 0.84, calcium 8.4.  White blood cells 6.5, hemoglobin  11.9, and platelets 503.   Over 30 minutes spent on discharge.      Robert Sax, MD  Electronically Signed      Robert Merritt, D.O.  Electronically Signed    IM/MEDQ  D:  08/24/2008  T:  08/24/2008  Job:  [id number]   cc:   Almedia Balls. Ranell Patrick, M.D.  Jason Coop, MD

## 2011-03-05 NOTE — Discharge Summary (Signed)
[place]  Patient:    Robert Merritt, Robert Merritt Visit Number: [id number] MRN: [id number]          Service Type: MED Location: [id number] Attending Physician:  Levy Sjogren Dictated by:   Kerrie Pleasure, M.D. Admit Date:  06/15/2001 Discharge Date: 06/22/2001   CC:         Outpatient Clinic   Discharge Summary  DISCHARGE DIAGNOSES: 1. Left lower leg cellulitis. 2. Peptic ulcer disease secondary to Helicobacter pylori.  DISCHARGE MEDICATIONS: 1. Tegretol 300 mg twice daily. 2. Biaxin 500 mg twice a day. 3. Flagyl 500 mg twice a day. 4. Protonix 40 mg twice a day. 5. Keflex 500 mg four times a day for eight days.  DISPOSITION AND FOLLOW-UP:  The patient was discharged in good health and was supposed to follow up with Dr. Mikeal Hawthorne on June 28, 2001 at 3 p.m. in the outpatient clinic.  PROCEDURES PERFORMED:  None.  CONSULTATIONS:  None.  BRIEF ADMISSION HISTORY:  Robert Merritt is a 35 year old African-American male with history of seizure who is on Tegretol since 1996.  He presented to ED with history of stomach pain and nausea and vomiting.  The stomach pain has been going on for several months off and on but has become worse in the last two days.  The patient has had a history of GERD and probable gastritis with ______ and has been taking Naprosyn b.i.d. for the last month or two prior to admission.  He has been taking it for left leg swelling.  He also has had nausea and vomiting which has been going on for the past couple of days.  His last regular meal was the day before admission with no bloody vomiting or melena or hematemesis.  He has past history of left leg swelling but not prior to presentation.  He saw a foot doctor who gave him Naprosyn according to him, which did not help, so he came in.  PAST MEDICAL HISTORY:  Seizure.  He is on Tegretol.  He has a history of inguinal hernia that was repaired when he was still an  infant.  MEDICATIONS: 1. Tegretol 300 mg b.i.d. 2. Naprosyn b.i.d.  ALLERGIES:  No known drug allergies.  FAMILY HISTORY:  His mother is alive with DJD.  His grandmother has diabetes and renal failure.  His father is alive and healthy.  SOCIAL HISTORY:  He lives in [place].  He works at [employer] and [employer].  No history of smoking, drinking, drugs.  He enjoys basketball and soccer apparently.  REVIEW OF SYSTEMS:  Noncontributory.  PHYSICAL EXAMINATION:  VITAL SIGNS:  Temperature 98.8, blood pressure 130/67, pulse 100, respirations 20, and he was saturating 99% on room air.  GENERAL:  He has NAD, pleasant, and cooperative young man.  HEENT:  [place]/AT.  EOMI.  OP clear.  Neck supple.  No icterus.  PERRL.  He did have some tinea on his face with some ______.  CARDIOVASCULAR:  Regular rate and rhythm with a stage 2/6 systolic ejection murmur best heard at the left lower sternal border.  CHEST:  Clear to auscultation.  Good air movement.  ABDOMEN:  Soft and not distended.  He had some diffusely tender points to the umbilicus, ______ the most, as well as left lower quadrant near the scrotum.  GENITOURINARY:  There was no hernia.  RECTAL:  The patient was found to be guaiac positive with normal rectal tone.  EXTREMITIES:  His left lower leg was swollen.  The foot to the mid calf was warm and tender.  The ankle was also swollen and shiny in appearance.  NEUROLOGIC:  His cranial exam was entirely normal on admission.  ADMISSION LABORATORY DATA:  White count 10.1, hemoglobin 14.1, platelets 221. He had a sodium of 135, potassium 3.5, chloride 100, CO2 27, BUN 18.  He had a pH of 7.44 and a PCO2 of 39.6.  His UA showed was positive for ketones, proteins, but negative for nitrite or leukocyte esterase.  He had some rare WBC and RBC.  His liver panel:  Total bilirubin 0.7, alkaline phosphatase 37, AST 29, ALT 19, total protein 7, albumin 3.7, lipase of 15.   PT was 15, INR 1.2, PTT 38.  His Tegretol level was 3.5.  His creatinine was 1 and glucose 105.  He had an acute abdominal series that was normal.  Chest films were also entirely normal.  Abdominal CT was normal.  Blood cultures were ordered. H. pylori test was ordered, which was positive.  Hemoccult stools were positive.  Left extremity Doppler was negative.  HOSPITAL COURSE: #1 - ABDOMINAL PAIN:  The patients H. pylori test came back positive and with occult blood.  The patient was started on therapy for duodenal ulcer, probably secondary to H. pylori.  GI was not consulted and no EGD was performed at this point, but the patient agrees to H. pylori eradication and he was started on PPI plus Protonix 40 mg, Flagyl, and Biaxin.  His abdominal pain did quite well on PPI and antibiotics.  #2 - LOWER EXTREMITY EDEMA:  Based on the swelling, the temperature, and the white count, a presumptive diagnosis of cellulitis was made.  As such, the patient was treated for cellulitis with Keflex.  He was initially started on Ancef, which was later converted to Keflex 500 mg for a total of three weeks treatment.  The patient did very well and he was asked to follow with Dr. Mikeal Hawthorne in the outpatient clinic after completing his therapy.  #3 - HEART MURMUR:  The patient had a grade 2/6 systolic murmur, and for that reason he was referred for an echocardiogram.  His echocardiogram showed an EF of 55-65% with entirely normal ventricular systolic function with no ventricular regional wall motion abnormalities.  No other action was taken about this. Dictated by:   Kerrie Pleasure, M.D. Attending Physician:  Levy Sjogren DD:  08/03/01 TD:  08/05/01 Job: [id number]

## 2011-03-05 NOTE — Discharge Summary (Signed)
Robert Merritt, Robert Merritt               ACCOUNT NO.:  [id number]   MEDICAL RECORD NO.:  [id number]          PATIENT TYPE:  INP   LOCATION:  5738                         FACILITY:  [facility]   PHYSICIAN:  Lollie Sails, MD      DATE OF BIRTH:  03-23-1976   DATE OF ADMISSION:  06/17/2007  DATE OF DISCHARGE:  06/20/2007                               DISCHARGE SUMMARY   DISCHARGE DIAGNOSES:  1. Right lower extremity cellulitis with suspected methicillin-      resistant staph aureus.  2. Mild mental retardation.  3. Gastroesophageal reflux disease.  4. History of peptic ulcer disease and treated for Helicobacter      pylori.  5. History of hemorrhoids.  6. Seizure disorder, diagnosed in 1995.  7. Status post inguinal hernia repair.   DISCHARGE MEDICATIONS:  1. Bactrim DS 1 tab twice daily for 10 days.  2. Vicodin 5/500 every four hours as needed for pain.  3. Pepcid 20 mg p.o. daily.  4. Hibiclens over the counter use as directed.   DISPOSITION/FOLLOW UP:  The patient will follow-up with Dr. Yetta Barre at the outpatient clinic of internal medicine at [facility] on  June 27, 2007 at 3:30 p.m.  He will need his right lower extremity  cellulitis to be followed up clinically.  No labs are needed.   PROCEDURE:  1. Plain film of the right lower extremity tibia and fibula performed      on June 17, 2007 indicating swelling throughout the lower leg.      Underlying bones and joints are unremarkable.  2. Plain film of the right foot performed on June 17, 2007      indicating no evidence of osteomyelitis.  No acute bony or joint      abnormalities.  There is soft tissue swelling noted.   CONSULTING PHYSICIAN:  None.   ADMISSION HISTORY AND PHYSICAL:  This is a 35 year old male with a  history of mild mental retardation, GERD, peptic ulcer disease who  presents with right lower extremity swelling.  The swelling started  yesterday. He apparently bumped his leg on his bed  approximately one  week ago.  The swelling originally began just above the ankle, but has  now spread from the lower knee all the way down to the lower right foot.  The patient is also reporting subjective fevers and nausea and vomiting  x 3 last night.  He has had difficulty standing on his leg secondary to  the pain experienced.  The pain is much worse with moving and with  weightbearing.  There is only mild improvement with Tylenol.  He was  seen originally in the urgent care, who sent him to the [facility]  emergency department for workup of his right lower extremity swelling  and pain.   PHYSICAL EXAMINATION:  ADMITTING VITAL SIGNS:  Temperature of 97.6.  Blood pressure 119/77.  Pulse of 88.  Respirations 16.  Saturating 99%  on room air.  ADMITTING PHYSICAL EXAM:  In general, the patient was in no acute  distress.  EYES:  Pupils are equal,  round and reactive to light bilaterally.  Extraocular eye movements are intact.  The patient's sclerae were  anicteric.  ENT:  He had moist mucous membranes.  No exudates or erythema on  oropharynx.  NECK EXAM:  The patient had a supple neck with no lymphadenopathy.  RESPIRATORY EXAM:  Lungs are clear to auscultation bilaterally.  The  patient had good air movement.  CARDIOVASCULAR EXAM:  Regular rate and rhythm, no murmurs, rubs or  gallops.  Good peripheral pulses.  GI EXAM:  The patient's abdomen was soft, nontender, nondistended with  good bowel sounds.  EXTREMITY EXAM:  The right lower extremity with swelling that extended  from just below the knee to the foot.  The patient had good dorsalis  pedis and posterior tibial pulses in the swollen leg.  No significant  erythema over the area of swelling.  There is a small abrasion just  anterior to the right prepatellar and pretibial area on the right lower  extremity.  SKIN EXAM:  No rashes were apparent.  MUSCLE STRENGTH EXAM:  Full range of motion, +5 muscle strength in all  extremities.   No significant pain in the right leg and right ankle with  passive and active motion within those joints.  NEUROLOGIC EXAM:  Cranial nerves II through XII are intact.  No focal  deficits in terms of sensation.  The patient's gait at this time is  normal.   LABORATORY DATA:  Admitting labs:  Sodium 132, potassium 3.8, chloride  99, bicarb 26, BUN 10, creatinine 1.08, glucose 96, hemoglobin 13.3,  white count 13.1, platelets 223,000, ANC 12.1, MCV 88.9.  Lower  extremities Dopplers were negative for DVT.   HOSPITAL COURSE:  Problem #1 - Right lower extremity swelling:  The  patient was admitted to a regular bed in the inpatient service.  Our  original differential included the possibility of right lower extremity  cellulitis versus DVT.  We ordered a right lower extremity venous  Doppler that was negative for any thrombosis.  We felt that the patient  likely had an acute cellulitis.  We treated him for the possibility of  MRSA with IV vancomycin.  Over the course of the patient's admission,  the patient's white count gradually trended down from 13 to 7.5 and then  4.1.  He remained completely afebrile during the course of his  admission.  The patient's right lower extremity pain continued to cause  him trouble.  He was originally treated with Vicodin, although switched  to Percocet 7.5/325 and experienced slightly better pain control in this  right lower extremity.  Blood cultures were negative x 2 upon discharge.  Wound culture was negative as well.  With im provement in pain control  and with improvement of the patient's level of swelling and erythema and  resolution of the patient's leukocytosis, we felt that we could safely  discharge the patient on p.o. antibiotics.  We gave the patient Bactrim  DS twice daily for the course of 10 days.  He was to follow-up with Dr.  Liliane Channel on September 9th to determine whether he needed any additional  treatments for his right lower extremity  cellulitis.  Problem #2 - GERD:  The patient was not complaining of any significant  epigastric pain following or before meals.  He was asymptomatic during  the course of his admission.  He was maintained on Protonix during his  admission and discharged with Pepcid.  He will need to be followed up  clinically  for this during his outpatient visits in the future.  Problem #3 - Mild mental retardation:  The patient communicates well and  appears to be fairly well-adjusted.  He has a job as a Merchandiser, retail at [employer].  He lives with his mother and receives adequate support from her  as needed for his medical conditions.   LABORATORY DATA:  Discharge labs:  Hemoglobin 11.7, hematocrit 35.2,  white blood cell count 3.1, platelet count 194,000.   VITAL SIGNS:  Discharge vitals:  Temperature 98.8.  Blood pressure 114  to 140 systolic, diastolic 68 to 90.  Pulse 76 to 98.  Respirations 20.  O2 saturation 98 to 99% on room air.      Lollie Sails, MD  Electronically Signed     CB/MEDQ  D:  08/01/2007  T:  08/02/2007  Job:  [id number]

## 2011-07-19 LAB — DIFFERENTIAL
Basophils Absolute: 0
Basophils Relative: 0
Basophils Relative: 0
Eosinophils Absolute: 0
Eosinophils Relative: 0
Lymphocytes Relative: 6 — ABNORMAL LOW
Lymphs Abs: 0.5 — ABNORMAL LOW
Monocytes Absolute: 1
Monocytes Absolute: 1.4 — ABNORMAL HIGH
Neutro Abs: 10.8 — ABNORMAL HIGH
Neutro Abs: 7.7
Neutrophils Relative %: 82 — ABNORMAL HIGH
Neutrophils Relative %: 85 — ABNORMAL HIGH

## 2011-07-19 LAB — CBC
HCT: 40.2
MCHC: 32.9
MCV: 84.1
MCV: 85.3
Platelets: 439 — ABNORMAL HIGH
RBC: 4.78
RBC: 5.07
WBC: 12.7 — ABNORMAL HIGH

## 2011-07-19 LAB — POCT I-STAT, CHEM 8
Calcium, Ion: 1.15
Creatinine, Ser: 1.3
Glucose, Bld: 107 — ABNORMAL HIGH
Hemoglobin: 15.6

## 2011-07-19 LAB — BASIC METABOLIC PANEL
BUN: 8
Chloride: 97
Creatinine, Ser: 0.95
GFR calc non Af Amer: >60
Glucose, Bld: 97
Potassium: 3.3 — ABNORMAL LOW

## 2011-07-20 ENCOUNTER — Encounter: Payer: Self-pay | Admitting: Internal Medicine

## 2011-07-20 ENCOUNTER — Ambulatory Visit (INDEPENDENT_AMBULATORY_CARE_PROVIDER_SITE_OTHER): Payer: Self-pay | Admitting: Internal Medicine

## 2011-07-20 VITALS — BP 139/86 | HR 64 | Temp 97.3°F | Ht 69.0 in | Wt 139.0 lb

## 2011-07-20 DIAGNOSIS — Z23 Encounter for immunization: Secondary | ICD-10-CM

## 2011-07-20 LAB — COMPREHENSIVE METABOLIC PANEL
ALT: 16
ALT: 17
AST: 20
Albumin: 3.9
BUN: 11
BUN: 9
Calcium: 9.5
Chloride: 100
Creatinine, Ser: 0.87
Creatinine, Ser: 0.96
GFR calc Af Amer: >60
GFR calc non Af Amer: >60
Glucose, Bld: 91
Potassium: 3.4 — ABNORMAL LOW
Sodium: 136
Sodium: 137
Total Bilirubin: 0.4
Total Bilirubin: 0.8
Total Protein: 8.3

## 2011-07-20 LAB — DIFFERENTIAL
Basophils Relative: 0
Basophils Relative: 0
Eosinophils Absolute: 0.1
Lymphocytes Relative: 11 — ABNORMAL LOW
Lymphocytes Relative: 18
Lymphs Abs: 0.9

## 2011-07-20 LAB — BASIC METABOLIC PANEL
BUN: 8
CO2: 26
Calcium: 8.4
Glucose, Bld: 98
Potassium: 3.8
Sodium: 136

## 2011-07-20 LAB — WOUND CULTURE

## 2011-07-20 LAB — CULTURE, BLOOD (ROUTINE X 2): Culture: NO GROWTH

## 2011-07-20 LAB — ANAEROBIC CULTURE

## 2011-07-20 LAB — CBC
HCT: 36.9 — ABNORMAL LOW
HCT: 39.9
HCT: 41.6
Hemoglobin: 11.9 — ABNORMAL LOW
Hemoglobin: 13.1
Hemoglobin: 13.6
MCHC: 32.1
MCHC: 32.6
MCV: 84
MCV: 84.3
Platelets: 584 — ABNORMAL HIGH
Platelets: 591 — ABNORMAL HIGH
RBC: 4.73
RBC: 4.96
RDW: 12.9
RDW: 13.1

## 2011-07-20 LAB — TISSUE CULTURE

## 2011-07-20 LAB — RAPID URINE DRUG SCREEN, HOSP PERFORMED
Amphetamines: NOT DETECTED
Barbiturates: NOT DETECTED
Benzodiazepines: NOT DETECTED
Tetrahydrocannabinol: NOT DETECTED

## 2011-07-20 LAB — GRAM STAIN

## 2011-07-20 LAB — PROTIME-INR: Prothrombin Time: 14.3

## 2011-07-20 LAB — APTT: aPTT: 37

## 2011-07-20 NOTE — Progress Notes (Signed)
Subjective:    Patient ID: Robert Merritt, male    DOB: January 17, 1976, 35 y.o.   MRN: [id number]  HPI 1. Patient is here for a "flu shot." Reports receiving it annually. No history of complications associated with vaccinations; no allergies to eggs. Denies any other concerns.   Review of Systems  Constitutional: Negative for fever, chills, weight loss, malaise/fatigue and diaphoresis.  HENT: Negative for hearing loss, ear pain, nosebleeds, congestion, sore throat, neck pain, tinnitus and ear discharge.   Eyes: Negative for blurred vision, double vision, photophobia and discharge.  Respiratory: Negative for cough, hemoptysis, sputum production, shortness of breath, wheezing and stridor.   Cardiovascular: Negative for chest pain, palpitations, orthopnea, claudication, leg swelling and PND.  Gastrointestinal: Negative for heartburn, abdominal pain, diarrhea, constipation and blood in stool.  Genitourinary: Negative for dysuria, urgency, frequency, hematuria and flank pain.  Musculoskeletal: Negative for myalgias, back pain, joint pain and falls.  Skin: Negative for itching and rash.  Neurological: Negative for dizziness, tingling, tremors, sensory change, speech change, focal weakness, seizures, loss of consciousness, weakness and headaches.  Hematological: Negative for environmental allergies. Does not bruise/bleed easily.  Psychiatric/Behavioral: Negative for depression, suicidal ideas, hallucinations, memory loss and substance abuse. The patient is not nervous/anxious and does not have insomnia.       Review of Systems  Constitutional: Negative for fever, chills, weight loss, malaise/fatigue and diaphoresis.  HENT: Negative for hearing loss, ear pain, nosebleeds, congestion, sore throat, neck pain, tinnitus and ear discharge.   Eyes: Negative for blurred vision, double vision, photophobia and discharge.  Respiratory: Negative for cough, hemoptysis, sputum production, shortness of breath,  wheezing and stridor.   Cardiovascular: Negative for chest pain, palpitations, orthopnea, claudication, leg swelling and PND.  Gastrointestinal: Negative for heartburn, abdominal pain, diarrhea, constipation and blood in stool.  Genitourinary: Negative for dysuria, urgency, frequency, hematuria and flank pain.  Musculoskeletal: Negative for myalgias, back pain, joint pain and falls.  Skin: Negative for itching and rash.  Neurological: Negative for dizziness, tingling, tremors, sensory change, speech change, focal weakness, seizures, loss of consciousness, weakness and headaches.  Endo/Heme/Allergies: Negative for environmental allergies. Does not bruise/bleed easily.  Psychiatric/Behavioral: Negative for depression, suicidal ideas, hallucinations, memory loss and substance abuse. The patient is not nervous/anxious and does not have insomnia.     Objective:   Physical Exam   Vitals: reviewed General: alert, well-developed, and cooperative to examination.  Head: normocephalic and atraumatic.  Eyes: vision grossly intact, pupils equal, pupils round, pupils reactive to light, no injection and anicteric.  Mouth: pharynx pink and moist, no erythema, and no exudates.  Neck: supple, full ROM, no thyromegaly, no JVD, and no carotid bruits.  Lungs: normal respiratory effort, no accessory muscle use, normal breath sounds, no crackles, and no wheezes. Heart: normal rate, regular rhythm, no murmur, no gallop, and no rub.  Abdomen: soft, non-tender, normal bowel sounds, no distention, no guarding, no rebound tenderness, no hepatomegaly, and no splenomegaly.  Msk: no joint swelling, no joint warmth, and no redness over joints.  Pulses: 2+ DP/PT pulses bilaterally Extremities: No cyanosis, clubbing, edema Neurologic: alert & oriented X3, cranial nerves II-XII intact, strength normal in all extremities, sensation intact to light touch, and gait normal.  Skin: turgor normal and no rashes.  Psych: Oriented  X3, memory intact for recent and remote, normally interactive, good eye contact, not anxious appearing, and not depressed appearing.        Assessment & Plan:  1. Health maintenance. Patient is to  receive a flu vax today. Side-effects reviewed with the patient. Patient is instructed to contact the clinic with any concerns. Safety :seatbelt use in car, avoidance of illicit drugs, smoking, alcohol and violence were discussed. 2. Hx of seizure disorder. No seizure activity since 1996 per patient's report.

## 2011-07-20 NOTE — Patient Instructions (Signed)
Please, call with any questions. Wash your hands after being outside the house. Take one multivitamin daily. Follow up in 6-12 months.

## 2011-07-30 LAB — COMPREHENSIVE METABOLIC PANEL
Albumin: 3.2 — ABNORMAL LOW
BUN: 8
Creatinine, Ser: 0.95
Glucose, Bld: 93
Total Bilirubin: 0.9
Total Protein: 6.3

## 2011-07-30 LAB — CBC
HCT: 37.7 — ABNORMAL LOW
Hemoglobin: 11.7 — ABNORMAL LOW
MCHC: 33.2
MCV: 85.6
Platelets: 173
Platelets: 196
RBC: 4.16 — ABNORMAL LOW
RBC: 4.74
RDW: 13.5
RDW: 13.3
WBC: 3.1 — ABNORMAL LOW

## 2011-07-30 LAB — CULTURE, BLOOD (ROUTINE X 2): Culture: NO GROWTH

## 2011-07-30 LAB — URINE CULTURE

## 2011-07-30 LAB — URINALYSIS, ROUTINE W REFLEX MICROSCOPIC
Hgb urine dipstick: NEGATIVE
Nitrite: NEGATIVE
Specific Gravity, Urine: 1.026
Urobilinogen, UA: 1

## 2011-07-30 LAB — WOUND CULTURE

## 2011-07-30 LAB — BASIC METABOLIC PANEL
BUN: 3 — ABNORMAL LOW
CO2: 26
Calcium: 8.2 — ABNORMAL LOW
Calcium: 8.5
Creatinine, Ser: 1.08
GFR calc Af Amer: >60
GFR calc non Af Amer: >60
Glucose, Bld: 103 — ABNORMAL HIGH

## 2011-07-30 LAB — DIFFERENTIAL
Basophils Absolute: 0
Basophils Relative: 0
Monocytes Relative: 4
Neutro Abs: 12.1 — ABNORMAL HIGH
Neutrophils Relative %: 93 — ABNORMAL HIGH

## 2012-07-31 ENCOUNTER — Ambulatory Visit (INDEPENDENT_AMBULATORY_CARE_PROVIDER_SITE_OTHER): Payer: Medicare Other | Admitting: *Deleted

## 2012-07-31 DIAGNOSIS — Z23 Encounter for immunization: Secondary | ICD-10-CM

## 2012-12-18 ENCOUNTER — Encounter: Payer: Self-pay | Admitting: Internal Medicine

## 2012-12-18 ENCOUNTER — Ambulatory Visit (INDEPENDENT_AMBULATORY_CARE_PROVIDER_SITE_OTHER): Payer: Medicare Other | Admitting: Internal Medicine

## 2012-12-18 VITALS — BP 130/75 | HR 79 | Temp 98.1°F | Ht 69.0 in | Wt 141.6 lb

## 2012-12-18 DIAGNOSIS — K279 Peptic ulcer, site unspecified, unspecified as acute or chronic, without hemorrhage or perforation: Secondary | ICD-10-CM | POA: Diagnosis not present

## 2012-12-18 DIAGNOSIS — Z114 Encounter for screening for human immunodeficiency virus [HIV]: Secondary | ICD-10-CM | POA: Insufficient documentation

## 2012-12-18 DIAGNOSIS — K219 Gastro-esophageal reflux disease without esophagitis: Secondary | ICD-10-CM | POA: Diagnosis not present

## 2012-12-18 DIAGNOSIS — Z136 Encounter for screening for cardiovascular disorders: Secondary | ICD-10-CM | POA: Diagnosis not present

## 2012-12-18 DIAGNOSIS — Z0000 Encounter for general adult medical examination without abnormal findings: Secondary | ICD-10-CM

## 2012-12-18 LAB — LIPID PANEL
Cholesterol: 185 mg/dL (ref 0–200)
HDL: 69 mg/dL (ref >39)
LDL Cholesterol: 107 mg/dL — ABNORMAL HIGH (ref 0–99)
Triglycerides: 43 mg/dL (ref <150)

## 2012-12-18 NOTE — Progress Notes (Signed)
Patient ID: Robert Merritt, male   DOB: 05-31-1976, 37 y.o.   MRN: [id number]  Subjective:   Patient ID: Robert Merritt male   DOB: [birth year] 37 y.o.   MRN: [id number]  HPI: RobertRobert Merritt is a 37 y.o. male w pmh of mental retardation, H. Pylori s/p eradication, GERD, and remote seizure d/o not requiring antiepileptic therapy (last sz 1995) presenting for routine health maintenance exam.  He tells me that he has no complaints today and feels great. He stays active with multiple sports teams and [employer]. He continues to work at [employer], a job which he has had for the past 21 years. He does have a past history of GERD but denies any symptoms of reflux today. He is not on H2 blocker or PPI therapy. He says his energy level is good and overall he is very happy. He continues to live with his mother.  Past Medical History  Diagnosis Date  . GERD (gastroesophageal reflux disease)   . PUD (peptic ulcer disease)     with h/o H.pylori infection  . Hemorrhoid   . Mental retardation, mild (I.Q. 50-70)   . Cellulitis     right leg  . Myalgia     intermittent lower extremity  . Seizures     not on medication. h/o grand mal seizures, last episode in 1995  . Inguinal lymphadenopathy   . Fatigue 06/2010    TSH low normal, Hb wnl, CMP wnl  . Groin pain     Chronic. S/p R LN aspiration culture which was negative in 2009.   No current outpatient prescriptions on file.   No current facility-administered medications for this visit.   Family History  Problem Relation Age of Onset  . Sleep apnea Mother   . Diabetes Maternal Grandmother   . Sickle cell anemia Brother     Half brother died of complications of sickle cell anemia at age 80  . Kidney failure Maternal Grandmother    History   Social History  . Marital Status: Single    Spouse Name: N/A    Number of Children: N/A  . Years of Education: N/A   Occupational History  . works at [employer]    Social History  Main Topics  . Smoking status: Never Smoker   . Smokeless tobacco: None  . Alcohol Use: No  . Drug Use: No  . Sexually Active: None   Other Topics Concern  . None   Social History Narrative   Lives at home with mother.   Mild mental retardation.   A special Olympics Olympian.   Works at [employer]. 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    Denies alcohol, tobacco, or drug use.   Has never been sexually active.   Review of Systems: Constitutional: Denies fever, chills, fatigue.  HEENT: Denies eye pain, ear pain, hearing loss, congestion, sore throat, dysphagia Respiratory: Denies SOB, DOE, cough  Cardiovascular: Denies chest pain, palpitations  Gastrointestinal: Denies nausea, vomiting, abdominal pain, diarrhea, constipation, blood in stool  Genitourinary: Denies dysuria, urgency, frequency, hematuria Musculoskeletal: denies joint pain or muscle pain Skin: Denies  rash Neurological: Denies dizziness, seizures, numbness Hematological: Denies adenopathy.  Psychiatric/Behavioral: reports mood is good he is happy  Objective:  Physical Exam: Filed Vitals:   12/18/12 1023  BP: 130/75  Pulse: 79  Temp: 98.1 F (36.7 C)  TempSrc: Oral  Height: 5\' 9"  (1.753 m)  Weight: 141 lb 9.6 oz (64.229 kg)  SpO2:  100%   Constitutional: Vital signs reviewed.  Patient is a well-developed and well-nourished male in no acute distress and cooperative with exam. Alert and oriented x3.  Head: Normocephalic and atraumatic Mouth: no erythema or exudates, MMM Eyes: PERRL, EOMI, conjunctivae normal, No scleral icterus.  Neck: Supple, Trachea midline normal ROM, No JVD, mass, thyromegaly  Cardiovascular: RRR, S1 normal, S2 normal, no MRG, pulses symmetric and intact bilaterally Pulmonary/Chest: CTAB, no wheezes, rales, or rhonchi Abdominal: Soft. Non-tender, non-distended, bowel sounds are normal, no masses, organomegaly, or guarding present.  GU: no CVA tenderness Musculoskeletal: No joint deformities,  erythema, or stiffness, ROM full and no nontender Hematology: no cervical LAD  Neurological: A&O x3, Strength is normal and symmetric bilaterally, cranial nerve II-XII are grossly intact, no focal motor deficit, sensory intact to light touch bilaterally.  Skin: Warm, dry and intact. No rash, cyanosis, or clubbing.  Psychiatric: mood and affect congruent and upbeat.  Assessment & Plan:

## 2012-12-18 NOTE — Assessment & Plan Note (Signed)
Patient is without complaints and history, review of systems, and physical exam unremarkable. He is very active with sports and continues to work at [employer]. No tobacco or substance abuse.  Depression screen negative. Counseled on healthy diet today. - Will check fasting lipid panel today - follow-up in 1 year or sooner if acute need arises

## 2012-12-18 NOTE — Assessment & Plan Note (Signed)
Denies any current signs or symptoms of GERD. No blood loss in stool or symptoms of anemia. Not on any H2 blocker or PPI. Do not see need at this time to start therapy.

## 2012-12-18 NOTE — Patient Instructions (Signed)
You are doing a GREAT job with all of your sports and work! I will check your cholesterol today. If anything looks abnormal, I will call you.  It was great to meet you. Go [employer]!!!  Dr. Heloise Beecham

## 2013-07-12 ENCOUNTER — Ambulatory Visit (INDEPENDENT_AMBULATORY_CARE_PROVIDER_SITE_OTHER): Payer: Medicare Other | Admitting: *Deleted

## 2013-07-12 DIAGNOSIS — Z23 Encounter for immunization: Secondary | ICD-10-CM

## 2014-04-11 ENCOUNTER — Ambulatory Visit (INDEPENDENT_AMBULATORY_CARE_PROVIDER_SITE_OTHER): Payer: Medicare Other | Admitting: Internal Medicine

## 2014-04-11 ENCOUNTER — Other Ambulatory Visit: Payer: Self-pay

## 2014-04-11 ENCOUNTER — Observation Stay (HOSPITAL_COMMUNITY)
Admission: AD | Admit: 2014-04-11 | Discharge: 2014-04-12 | Disposition: A | Discharge status: Home / Self Care | Payer: Medicare Other | Source: Ambulatory Visit | Attending: Internal Medicine | Admitting: Internal Medicine

## 2014-04-11 ENCOUNTER — Encounter (HOSPITAL_COMMUNITY): Payer: Self-pay | Admitting: General Practice

## 2014-04-11 ENCOUNTER — Encounter: Payer: Self-pay | Admitting: Internal Medicine

## 2014-04-11 ENCOUNTER — Observation Stay (HOSPITAL_COMMUNITY): Payer: Medicare Other

## 2014-04-11 VITALS — BP 118/87 | HR 141

## 2014-04-11 DIAGNOSIS — K5289 Other specified noninfective gastroenteritis and colitis: Principal | ICD-10-CM | POA: Insufficient documentation

## 2014-04-11 DIAGNOSIS — R112 Nausea with vomiting, unspecified: Secondary | ICD-10-CM | POA: Diagnosis not present

## 2014-04-11 DIAGNOSIS — K219 Gastro-esophageal reflux disease without esophagitis: Secondary | ICD-10-CM | POA: Diagnosis not present

## 2014-04-11 DIAGNOSIS — R030 Elevated blood-pressure reading, without diagnosis of hypertension: Secondary | ICD-10-CM

## 2014-04-11 DIAGNOSIS — E875 Hyperkalemia: Secondary | ICD-10-CM | POA: Diagnosis not present

## 2014-04-11 DIAGNOSIS — G40909 Epilepsy, unspecified, not intractable, without status epilepticus: Secondary | ICD-10-CM | POA: Insufficient documentation

## 2014-04-11 DIAGNOSIS — M6282 Rhabdomyolysis: Secondary | ICD-10-CM | POA: Diagnosis not present

## 2014-04-11 DIAGNOSIS — N179 Acute kidney failure, unspecified: Secondary | ICD-10-CM | POA: Insufficient documentation

## 2014-04-11 DIAGNOSIS — F79 Unspecified intellectual disabilities: Secondary | ICD-10-CM | POA: Insufficient documentation

## 2014-04-11 DIAGNOSIS — R109 Unspecified abdominal pain: Secondary | ICD-10-CM | POA: Diagnosis not present

## 2014-04-11 DIAGNOSIS — K529 Noninfective gastroenteritis and colitis, unspecified: Secondary | ICD-10-CM | POA: Insufficient documentation

## 2014-04-11 DIAGNOSIS — R569 Unspecified convulsions: Secondary | ICD-10-CM | POA: Diagnosis present

## 2014-04-11 DIAGNOSIS — F70 Mild intellectual disabilities: Secondary | ICD-10-CM | POA: Diagnosis present

## 2014-04-11 HISTORY — DX: Family history of other specified conditions: Z84.89

## 2014-04-11 LAB — LACTIC ACID, PLASMA: Lactic Acid, Venous: 3 mmol/L — ABNORMAL HIGH (ref 0.5–2.2)

## 2014-04-11 LAB — CBC WITH DIFFERENTIAL/PLATELET
Basophils Absolute: 0 10*3/uL (ref 0.0–0.1)
Basophils Relative: 0 % (ref 0–1)
Eosinophils Absolute: 0 10*3/uL (ref 0.0–0.7)
Eosinophils Relative: 0 % (ref 0–5)
HEMATOCRIT: 52.4 % — AB (ref 39.0–52.0)
Hemoglobin: 17.8 g/dL — ABNORMAL HIGH (ref 13.0–17.0)
LYMPHS ABS: 1.3 10*3/uL (ref 0.7–4.0)
LYMPHS PCT: 9 % — AB (ref 12–46)
MCH: 28.8 pg (ref 26.0–34.0)
MCHC: 34 g/dL (ref 30.0–36.0)
MCV: 84.7 fL (ref 78.0–100.0)
Monocytes Absolute: 0.4 10*3/uL (ref 0.1–1.0)
Monocytes Relative: 3 % (ref 3–12)
NEUTROS ABS: 13 10*3/uL — AB (ref 1.7–7.7)
Neutrophils Relative %: 88 % — ABNORMAL HIGH (ref 43–77)
PLATELETS: 349 10*3/uL (ref 150–400)
RBC: 6.19 MIL/uL — AB (ref 4.22–5.81)
RDW: 13 % (ref 11.5–15.5)
WBC: 14.8 10*3/uL — AB (ref 4.0–10.5)

## 2014-04-11 LAB — COMPLETE METABOLIC PANEL WITH GFR
ALBUMIN: 5.5 g/dL — AB (ref 3.5–5.2)
ALT: 22 U/L (ref 0–53)
AST: 32 U/L (ref 0–37)
Alkaline Phosphatase: 48 U/L (ref 39–117)
BUN: 32 mg/dL — ABNORMAL HIGH (ref 6–23)
CALCIUM: 10.5 mg/dL (ref 8.4–10.5)
CHLORIDE: 92 meq/L — AB (ref 96–112)
CO2: 24 mEq/L (ref 19–32)
Creat: 2.91 mg/dL — ABNORMAL HIGH (ref 0.50–1.35)
GFR, Est African American: 30 mL/min — ABNORMAL LOW
GFR, Est Non African American: 26 mL/min — ABNORMAL LOW
Glucose, Bld: 146 mg/dL — ABNORMAL HIGH (ref 70–99)
POTASSIUM: 6.3 meq/L — AB (ref 3.5–5.3)
SODIUM: 137 meq/L (ref 135–145)
TOTAL PROTEIN: 9.9 g/dL — AB (ref 6.0–8.3)
Total Bilirubin: 0.6 mg/dL (ref 0.2–1.2)

## 2014-04-11 LAB — BASIC METABOLIC PANEL
BUN: 32 mg/dL — ABNORMAL HIGH (ref 6–23)
CALCIUM: 8.7 mg/dL (ref 8.4–10.5)
CO2: 21 meq/L (ref 19–32)
Chloride: 96 mEq/L (ref 96–112)
Creatinine, Ser: 2.38 mg/dL — ABNORMAL HIGH (ref 0.50–1.35)
GFR calc Af Amer: 38 mL/min — ABNORMAL LOW (ref >90)
GFR calc non Af Amer: 32 mL/min — ABNORMAL LOW (ref >90)
GLUCOSE: 136 mg/dL — AB (ref 70–99)
Potassium: 5.2 mEq/L (ref 3.7–5.3)
Sodium: 139 mEq/L (ref 137–147)

## 2014-04-11 LAB — URINALYSIS, ROUTINE W REFLEX MICROSCOPIC
Bilirubin Urine: NEGATIVE
Glucose, UA: NEGATIVE mg/dL
Ketones, ur: 15 mg/dL — AB
LEUKOCYTES UA: NEGATIVE
Nitrite: NEGATIVE
PH: 5 (ref 5.0–8.0)
Protein, ur: 100 mg/dL — AB
Specific Gravity, Urine: 1.024 (ref 1.005–1.030)
Urobilinogen, UA: 0.2 mg/dL (ref 0.0–1.0)

## 2014-04-11 LAB — URINE MICROSCOPIC-ADD ON

## 2014-04-11 LAB — TROPONIN I: Troponin I: <0.3 ng/mL (ref <0.30)

## 2014-04-11 LAB — CREATININE, URINE, RANDOM: Creatinine, Urine: 425.59 mg/dL

## 2014-04-11 LAB — GLUCOSE, CAPILLARY: Glucose-Capillary: 157 mg/dL — ABNORMAL HIGH (ref 70–99)

## 2014-04-11 LAB — POTASSIUM
POTASSIUM: 7.2 meq/L — AB (ref 3.7–5.3)
Potassium: 5.1 mEq/L (ref 3.7–5.3)

## 2014-04-11 LAB — LIPASE: Lipase: 16 U/L (ref 0–75)

## 2014-04-11 MED ORDER — SODIUM CHLORIDE 0.9 % IV SOLN: INTRAVENOUS | Status: DC

## 2014-04-11 MED ORDER — SODIUM CHLORIDE 0.9 % IV BOLUS (SEPSIS)
1000.0000 mL | Freq: Once | INTRAVENOUS | Status: AC
  Administered 2014-04-11: 1000 mL via INTRAVENOUS

## 2014-04-11 MED ORDER — ENOXAPARIN SODIUM 40 MG/0.4ML ~~LOC~~ SOLN
40.0000 mg | SUBCUTANEOUS | Status: DC
  Filled 2014-04-11: qty 0.4

## 2014-04-11 MED ORDER — ALBUTEROL SULFATE (2.5 MG/3ML) 0.083% IN NEBU
INHALATION_SOLUTION | RESPIRATORY_TRACT | Status: AC
  Filled 2014-04-11: qty 9

## 2014-04-11 MED ORDER — ALBUTEROL SULFATE (2.5 MG/3ML) 0.083% IN NEBU
10.0000 mg | INHALATION_SOLUTION | Freq: Once | RESPIRATORY_TRACT | Status: AC
  Administered 2014-04-11: 10 mg via RESPIRATORY_TRACT
  Filled 2014-04-11: qty 12

## 2014-04-11 MED ORDER — SODIUM CHLORIDE 0.9 % IV SOLN
INTRAVENOUS | Status: AC
  Administered 2014-04-11 (×2): via INTRAVENOUS

## 2014-04-11 MED ORDER — ONDANSETRON HCL 4 MG/2ML IJ SOLN: 4.0000 mg | Freq: Three times a day (TID) | INTRAMUSCULAR | Status: AC | PRN

## 2014-04-11 MED ORDER — PROMETHAZINE HCL 25 MG/ML IJ SOLN: 12.5000 mg | Freq: Once | INTRAMUSCULAR | Status: DC

## 2014-04-11 MED ORDER — SODIUM POLYSTYRENE SULFONATE 15 GM/60ML PO SUSP
15.0000 g | Freq: Once | ORAL | Status: AC
  Administered 2014-04-11: 15 g via ORAL
  Filled 2014-04-11: qty 60

## 2014-04-11 MED ORDER — MORPHINE SULFATE 2 MG/ML IJ SOLN: 1.0000 mg | INTRAMUSCULAR | Status: DC | PRN

## 2014-04-11 MED ORDER — SODIUM CHLORIDE 0.9 % IV SOLN
1.0000 g | Freq: Once | INTRAVENOUS | Status: AC
  Administered 2014-04-11: 1 g via INTRAVENOUS
  Filled 2014-04-11: qty 10

## 2014-04-11 MED ORDER — DEXTROSE 50 % IV SOLN
INTRAVENOUS | Status: AC
  Filled 2014-04-11: qty 50

## 2014-04-11 MED ORDER — INSULIN ASPART 100 UNIT/ML IV SOLN
10.0000 [IU] | Freq: Once | INTRAVENOUS | Status: AC
  Administered 2014-04-11: 10 [IU] via INTRAVENOUS

## 2014-04-11 MED ORDER — SODIUM BICARBONATE 8.4 % IV SOLN
50.0000 meq | Freq: Once | INTRAVENOUS | Status: AC
  Administered 2014-04-11: 50 meq via INTRAVENOUS
  Filled 2014-04-11: qty 50

## 2014-04-11 MED ORDER — SODIUM CHLORIDE 0.9 % IV BOLUS (SEPSIS): 1000.0000 mL | Freq: Once | INTRAVENOUS | Status: DC

## 2014-04-11 MED ORDER — SODIUM CHLORIDE 0.9 % IV BOLUS (SEPSIS)
1000.0000 mL | INTRAVENOUS | Status: DC
  Administered 2014-04-11: 1000 mL via INTRAVENOUS

## 2014-04-11 MED ORDER — SODIUM CHLORIDE 0.9 % IV BOLUS (SEPSIS): 500.0000 mL | Freq: Once | INTRAVENOUS | Status: DC

## 2014-04-11 MED ORDER — FUROSEMIDE 10 MG/ML IJ SOLN
40.0000 mg | Freq: Once | INTRAMUSCULAR | Status: AC
  Administered 2014-04-11: 40 mg via INTRAVENOUS
  Filled 2014-04-11: qty 4

## 2014-04-11 MED ORDER — DEXTROSE 50 % IV SOLN
1.0000 | Freq: Once | INTRAVENOUS | Status: AC
  Administered 2014-04-11: 50 mL via INTRAVENOUS

## 2014-04-11 MED ORDER — DEXTROSE 50 % IV SOLN
INTRAVENOUS | Status: AC
  Administered 2014-04-11: 50 mL via INTRAVENOUS
  Filled 2014-04-11: qty 50

## 2014-04-11 MED ORDER — HEPARIN SODIUM (PORCINE) 5000 UNIT/ML IJ SOLN
5000.0000 [IU] | Freq: Three times a day (TID) | INTRAMUSCULAR | Status: DC
  Administered 2014-04-11 – 2014-04-12 (×3): 5000 [IU] via SUBCUTANEOUS
  Filled 2014-04-11 (×5): qty 1

## 2014-04-11 MED ORDER — PROMETHAZINE HCL 25 MG/ML IJ SOLN
12.5000 mg | Freq: Once | INTRAMUSCULAR | Status: AC
  Administered 2014-04-11: 12.5 mg via INTRAMUSCULAR

## 2014-04-11 NOTE — Progress Notes (Signed)
I saw patient and discussed his care with Dr. Sherrine MaplesGlenn at the time of the visit.  We reviewed the resident's history and exam and pertinent patient test results.  I agree with the assessment, diagnosis, and plan of care documented in the resident's note, including plan to admit to hospital.

## 2014-04-11 NOTE — Progress Notes (Signed)
NURSING PROGRESS NOTE  Robert BeltonDwight L Merritt [id number] Admission Data: 04/11/2014 7:54 PM Attending Jennalynn Rivard: Aletta EdouardShilpa Bhardwaj, MD WUJ:[id number]:Jones, Jonita AlbeeEden, MD Code Status: Full   Robert BeltonDwight L Merritt is a 38 y.o. male patient admitted from ED:  -No acute distress noted.  -No complaints of shortness of breath.  -No complaints of chest pain.   Cardiac Monitoring: Box # 6 in place. Cardiac monitor yields:normal sinus rhythm.  Blood pressure 138/84, pulse 90, temperature 97.8 F (36.6 C), temperature source Oral, resp. rate 18, height 5\' 7"  (1.702 m), weight 61.372 kg (135 lb 4.8 oz), SpO2 100.00%.   IV Fluids:  IV in place, occlusive dsg intact without redness, IV cath wrist right, condition patent and no redness normal saline.   Allergies:  Review of patient's allergies indicates no known allergies.  Past Medical History:   has a past medical history of GERD (gastroesophageal reflux disease); PUD (peptic ulcer disease); Hemorrhoid; Mental retardation, mild (I.Q. 50-70); Cellulitis; Myalgia; Seizures; Inguinal lymphadenopathy; Fatigue (06/2010); Groin pain; and Family history of anesthesia complication.  Past Surgical History:   has past surgical history that includes Hernia repair.  Social History:   reports that he has never smoked. He has never used smokeless tobacco. He reports that he does not drink alcohol or use illicit drugs.  Skin: Intact  Patient/Family orientated to room. Information packet given to patient/family. Admission inpatient armband information verified with patient/family to include name and date of birth and placed on patient arm. Side rails up x 2, fall assessment and education completed with patient/family. Patient/family able to verbalize understanding of risk associated with falls and verbalized understanding to call for assistance before getting out of bed. Call light within reach. Patient/family able to voice and demonstrate understanding of unit orientation instructions.     Will continue to evaluate and treat per MD orders.

## 2014-04-11 NOTE — Progress Notes (Signed)
Patient ID: Robert Merritt, male   DOB: 08/08/1976, 38 y.o.   MRN: [id number]  Subjective:   Patient ID: Robert Merritt male   DOB: 08/08/1976 38 y.o.   MRN: [id number]  HPI: Robert Merritt is a 38 y.o. M w/ PMH seizure d/o and GERD presents for an acute visit.   He endorses abdominal pain and N/V in addition to headaches, sore throat, cough, fatigue, runny nose, and chills that began yesterday. He was at work at [employer] yesterday and had to leave because he was not feeling well. Per his mother, who is with him today, the patient has been having increased bowel movements over the past few days and began having nausea yesterday and began vomiting overnight. She states that he was vomiting throughout the night last night and is now having dry heaves. He has been unable to keep any food or fluid down. His mother is not sure if he has had any sick contacts at work but no one has been sick at home.    Past Medical History  Diagnosis Date  . GERD (gastroesophageal reflux disease)   . PUD (peptic ulcer disease)     with h/o H.pylori infection  . Hemorrhoid   . Mental retardation, mild (I.Q. 50-70)   . Cellulitis     right leg  . Myalgia     intermittent lower extremity  . Seizures     not on medication. h/o grand mal seizures, last episode in 1995  . Inguinal lymphadenopathy   . Fatigue 06/2010    TSH low normal, Hb wnl, CMP wnl  . Groin pain     Chronic. S/p R LN aspiration culture which was negative in 2009.   No current outpatient prescriptions on file.   No current facility-administered medications for this visit.   Family History  Problem Relation Age of Onset  . Sleep apnea Mother   . Diabetes Maternal Grandmother   . Sickle cell anemia Brother     Half brother died of complications of sickle cell anemia at age 40  . Kidney failure Maternal Grandmother    History   Social History  . Marital Status: Single    Spouse Name: N/A    Number of Children: N/A  . Years of  Education: N/A   Occupational History  . works at [employer]    Social History Main Topics  . Smoking status: Never Smoker   . Smokeless tobacco: None  . Alcohol Use: No  . Drug Use: No  . Sexual Activity: None   Other Topics Concern  . None   Social History Narrative   Lives at home with mother.   Mild mental retardation.   A special Olympics Olympian.   Works at [employer]. 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    Denies alcohol, tobacco, or drug use.   Has never been sexually active.   Review of Systems: Constitutional: Unsure if he has had fevers, +chills and fatigue.  HEENT: +runny nose, nasal congestion Respiratory: +cough Cardiovascular: Denies chest pain or leg swelling.  Gastrointestinal: +nausea, vomiting, abdominal pain, diarrhea Genitourinary: Denies dysuria, urgency, frequency Musculoskeletal: Denies back pain, joint swelling Skin: Denies rash and wound.  Neurological: +dizziness and headaches.  Psychiatric/Behavioral: Denies suicidal ideation, mood changes   Objective:  Physical Exam: Filed Vitals:   04/11/14 0830 04/11/14 0840 04/11/14 0845 04/11/14 0850  BP: 138/89 133/89 142/93 118/87  Pulse: 115 102 111 141  SpO2: 98%  Constitutional: Vital signs reviewed.  Patient is an ill appearing male in mild distress who is actively vomiting. Head: Normocephalic and atraumatic Nose: +mucus present.  Mouth: Thick saliva. Tongue appears dry. Eyes: PERRL, EOMI.  Neck: Supple, Trachea midline Cardiovascular: Tachycardic, regular rhythm Pulmonary/Chest: Normal respiratory effort, CTAB, no wheezes, rales, or rhonchi Abdominal: Soft. Non-distended, Diffusely tender to palpation, w/o guarding or rebound. Bowel sounds are hyperactive.  GU: no CVA tenderness Musculoskeletal: No joint deformities, erythema, or stiffness. Moves all 4 extremities Neurological: A&O x3, no focal deficits.  Skin: Warm, dry and intact.  Psychiatric: Stable mood and affect.   Assessment &  Plan:   Please refer to Problem List based Assessment and Plan

## 2014-04-11 NOTE — H&P (Signed)
Date: 04/11/2014               Patient Name:  Robert Merritt MRN: [id number]  DOB: [birth year] Age / Sex: 38 y.o., male   PCP: Courtney ParisEden W Jones, MD         Medical Service: Internal Medicine Teaching Service         Attending Physician: Dr. Aletta EdouardShilpa Bhardwaj, MD    First Contact: Dr. Wendall StadeE. Emokpae Pager: [phone]  Second Contact: Dr. Jorje GuildS. Kennerly Pager: [phone]       After Hours (After 5p/  First Contact Pager: [phone]  weekends / holidays): Second Contact Pager: [phone]   Chief Complaint: vomiting and abdominal pain  History of Present Illness: Robert Merritt is a 38 yo with history significant for mental retardation, seizure disorder, GERD and prior peptic ulcer disease who presented to [facility] along with his mother with reports of nausea, vomiting, diarrhea, and abdominal pain for the past day.  Patient and mother reports that he was not feeling well overall last week but was without specific complaints other than "laying in the bed longer than usual" and increase bowel movements.  Over the course of the past day, they state that Robert Merritt started vomiting more than 5 times and was "up all night vomiting".  He is complaining of weakness, lightheadedness, sore throat and diffuse abdominal pain which he believes all started at the same time. Denies fever, diaphoresis, chest pain, or shortness of breath but states that he has chills. 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  Meds: Current Facility-Administered Medications  Medication Dose Route Frequency Provider Last Rate Last Dose  . 0.9 %  sodium chloride infusion   Intravenous Continuous Genelle GatherKathryn F Glenn, MD      . 0.9 %  sodium chloride infusion   Intravenous Continuous Manuela SchwartzKaren-Akosua M Zariah Cavendish, MD      . enoxaparin (LOVENOX) injection 40 mg  40 mg Subcutaneous Q24H Manuela SchwartzKaren-Akosua M Jonesha Tsuchiya, MD      . morphine 2 MG/ML injection 1 mg  1 mg Intravenous Q4H PRN Genelle GatherKathryn F Glenn, MD       . ondansetron [facility](ZOFRAN) injection 4 mg  4 mg Intravenous Q8H PRN Genelle GatherKathryn F Glenn, MD      . sodium chloride 0.9 % bolus 1,000 mL  1,000 mL Intravenous Once Genelle GatherKathryn F Glenn, MD      . sodium chloride 0.9 % bolus 1,000 mL  1,000 mL Intravenous STAT Manuela SchwartzKaren-Akosua M Kashawn Dirr, MD        Allergies: Allergies as of 04/11/2014  . (No Known Allergies)   Past Medical History  Diagnosis Date  . GERD (gastroesophageal reflux disease)   . PUD (peptic ulcer disease)     with h/o H.pylori infection  . Hemorrhoid   . Mental retardation, mild (I.Q. 50-70)   . Cellulitis     right leg  . Myalgia     intermittent lower extremity  . Seizures     not on medication. h/o grand mal seizures, last episode in 1995  . Inguinal lymphadenopathy   . Fatigue 06/2010    TSH low normal, Hb wnl, CMP wnl  . Groin pain     Chronic. S/p R LN aspiration culture which was negative in 2009.   No past surgical history on file. Family History  Problem Relation Age of Onset  . Sleep apnea Mother   . Diabetes Maternal Grandmother   .  Sickle cell anemia Brother     Half brother died of complications of sickle cell anemia at age 40  . Kidney failure Maternal Grandmother    History   Social History  . Marital Status: Single    Spouse Name: N/A    Number of Children: N/A  . Years of Education: N/A   Occupational History  . works at [employer]    Social History Main Topics  . Smoking status: Never Smoker   . Smokeless tobacco: Not on file  . Alcohol Use: No  . Drug Use: No  . Sexual Activity: Not on file   Other Topics Concern  . Not on file   Social History Narrative   Lives at home with mother.   Mild mental retardation.   A special Olympics Olympian.   Works at [employer]. 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    Denies alcohol, tobacco, or drug use.   Has never been sexually active.     Review of Systems: Constitutional:  Endorse chills, decreased appetite and fatigue, Denies fever or diaphoresis    HEENT: Endorse sore throat, sinus congestion, post-nasal drip, Denies rhinorrhea, sneezing, mouth sores, trouble swallowing, neck pain, neck stiffness and tinnitus.  Respiratory: Denies SOB, DOE, cough, chest tightness, and wheezing.  Cardiovascular: Denies chest pain, palpitations and leg swelling.  Gastrointestinal: Endorse nausea, vomiting, abdominal pain, diarrhea, Denies constipation, blood in stool and abdominal distention.  Genitourinary: Denies dysuria, hematuria  Musculoskeletal: Denies myalgias, back pain, joint swelling, arthralgias and gait problem.   Skin: Denies rash and wound.  Neurological: Endorses light-headedness, dizziness Denies seizures, syncope, numbness and headaches.   Hematological: Denies easy bruising  Psychiatric/ Behavioral: Denies confusion     Physical Exam: General: Well-developed, well-nourished, AA male, in no acute distress mother at bedside; HEENT: Normocephalic, atraumatic, PERRLA, EOMI, dry mucous membrane, supple neck Lungs: Normal respiratory effort. Clear to auscultation bilaterally from apices to bases without crackles or wheezes appreciated. Heart: slightly tachycardic, regular rhythm, normal S1 and S2, no gallop, murmur, or rubs appreciated. Abdomen: BS normoactive. Soft, Nondistended, mildly tender throughout. No masses or organomegaly appreciated. Extremities: No pretibial edema, distal pulses intact Neurologic: grossly non-focal, alert and oriented x3, appropriate and cooperative throughout examination. Psych: normal mood and affect, converses well with staff   Lab results: Basic Metabolic Panel:  Recent Labs  16/07/9605/25/15 0926  NA 137  K 6.3*  CL 92*  CO2 24  GLUCOSE 146*  BUN 32*  CREATININE 2.91*  CALCIUM 10.5   Liver Function Tests:  Recent Labs  04/11/14 0926  AST 32  ALT 22  ALKPHOS 48  BILITOT 0.6  PROT 9.9*  ALBUMIN 5.5*    Recent Labs  04/11/14 0926  LIPASE 16   CBC:  Recent Labs  04/11/14 0926  WBC  14.8*  NEUTROABS 13.0*  HGB 17.8*  HCT 52.4*  MCV 84.7  PLT 349    Other results: EKG: 98 bpm normal sinus rhythm, diffuse peaked Twaves.  Assessment & Plan by Problem: Robert Merritt is a 38 yo who presented to clinic with nausea, vomiting, diarrhea, and abdominal pain x 1 day admitted with clinical picture worrisome for gastroenteritis vs possible bowel obstruction in setting of volume depletion and hyperkalemia.  #1 Abdominal pain with SIRS: nausea, vomiting, diarrhea and volume depletion with leukocytosis. Likely secondary to gastroenteritis. Possible GI source of infection would qualify as sepsis.  It would be prudent to rule out partial bowel obstruction.  Abdominal Imaging ordered from clinic and  pending at this time. Patient appears comfortable on exam without signs of acute abdomen. -volume resuscitate 1 NS bolus now then 1.5 maintenance fluids @ 150cc/h -follow vitals signs (needs temperature and RR documented) -abd XRay pending -serial abdominal exams -stool studies -Zofran for nausea -NPO for now, advance as tolerated -UA   #2 Hyperkalemia: in setting of Acute Renal Failure secondary to volume depletion, K of 6.3, EKF with peaked T waves, no chest pain, palpitations or shortness of breath.  He remains hemodynamically stable on exam with adequate bp, and oxygenation but is tachycardic. -Hyperkalemia protocol ordered with 1L NS bolus STAT -monitor with telemetry -frequent K monitoring and serial EKG -if repeat level >6.4 consider nebulized albuterol, IV Novolog and dextrose -cycle troponins x3   #3 Acute Renal Failure: likely prerenal secondary to volume depletion from vomiting and diarrhea -aggressive IV fluids -monitor creatinine -check FENa -check urine electrolytes  DVT ppx: Lovenox SQ  FULL CODE  Dispo: Disposition is deferred at this time, awaiting improvement of current medical problems. Anticipated discharge in approximately 1-3 day(s).   The patient does  have a current PCP Courtney Paris, MD) and does need an [facility] hospital follow-up appointment after discharge.  The patient does not have transportation limitations that hinder transportation to clinic appointments.  Signed: Manuela Schwartz, MD 04/11/2014, 12:01 PM

## 2014-04-11 NOTE — Progress Notes (Signed)
CRITICAL VALUE ALERT  Critical value received:  K 7.2  Date of notification:  04/11/14  Time of notification:  1300  Critical value read back:Yes.    Nurse who received alert:  Jacquiline DoeJennifer Zhu, RN  MD notified (1st page): Dr. Bosie ClosSchooler  Time of first page:  1330  Responding MD:  Dr. Bosie ClosSchooler  Time MD responded:  1400

## 2014-04-11 NOTE — Assessment & Plan Note (Addendum)
Pt still vomiting this morning in the clinic and has diffuse abdominal pain w/o rebound or guarding. No sick contacts at home, but he works daily at [employer] and is exposed to hundred of people daily there. He is orthostatic: Lying 138/89, Sitting 142/93, Standing 118/87. No tick exposure, so RMSF, which can present with N/V is unlikely. No recent antibiotic use. Symptoms are likely 2/2 viral gastroenteritis, but will have to r/o other causes. Will start IVF and antiemetics and recheck his blood pressure after fluids. Checking labs, including lipase to r/o pancreatitis.  - Normal saline bolus 1L - IM Phenergan 12.5mg  x1 - CBC, CMP - Upright and supine abdominal films to evaluate for obstruction - Admit to the hospital overnight for observation

## 2014-04-12 ENCOUNTER — Other Ambulatory Visit: Payer: Self-pay

## 2014-04-12 DIAGNOSIS — N179 Acute kidney failure, unspecified: Secondary | ICD-10-CM | POA: Diagnosis not present

## 2014-04-12 DIAGNOSIS — M6282 Rhabdomyolysis: Secondary | ICD-10-CM | POA: Diagnosis not present

## 2014-04-12 DIAGNOSIS — K5289 Other specified noninfective gastroenteritis and colitis: Secondary | ICD-10-CM

## 2014-04-12 DIAGNOSIS — E875 Hyperkalemia: Secondary | ICD-10-CM | POA: Diagnosis not present

## 2014-04-12 LAB — BASIC METABOLIC PANEL
BUN: 24 mg/dL — ABNORMAL HIGH (ref 6–23)
BUN: 21 mg/dL (ref 6–23)
CALCIUM: 9.3 mg/dL (ref 8.4–10.5)
CALCIUM: 9.5 mg/dL (ref 8.4–10.5)
CO2: 24 meq/L (ref 19–32)
CO2: 25 meq/L (ref 19–32)
Chloride: 98 mEq/L (ref 96–112)
Chloride: 96 mEq/L (ref 96–112)
Creatinine, Ser: 1.35 mg/dL (ref 0.50–1.35)
Creatinine, Ser: 1.17 mg/dL (ref 0.50–1.35)
GFR calc Af Amer: 75 mL/min — ABNORMAL LOW (ref >90)
GFR calc Af Amer: 89 mL/min — ABNORMAL LOW (ref >90)
GFR calc non Af Amer: 77 mL/min — ABNORMAL LOW (ref >90)
GFR, EST NON AFRICAN AMERICAN: 64 mL/min — AB (ref >90)
GLUCOSE: 87 mg/dL (ref 70–99)
Glucose, Bld: 98 mg/dL (ref 70–99)
POTASSIUM: 5.6 meq/L — AB (ref 3.7–5.3)
Potassium: 4.5 mEq/L (ref 3.7–5.3)
SODIUM: 138 meq/L (ref 137–147)
Sodium: 136 mEq/L — ABNORMAL LOW (ref 137–147)

## 2014-04-12 LAB — GI PATHOGEN PANEL BY PCR, STOOL
C DIFFICILE TOXIN A/B: NEGATIVE
Campylobacter by PCR: NEGATIVE
Cryptosporidium by PCR: NEGATIVE
E COLI (STEC): NEGATIVE
E coli (ETEC) LT/ST: NEGATIVE
E coli 0157 by PCR: NEGATIVE
G lamblia by PCR: NEGATIVE
Norovirus GI/GII: NEGATIVE
ROTAVIRUS A BY PCR: NEGATIVE
SALMONELLA BY PCR: NEGATIVE
SHIGELLA BY PCR: NEGATIVE

## 2014-04-12 LAB — CK
Total CK: 2708 U/L — ABNORMAL HIGH (ref 7–232)
Total CK: 2361 U/L — ABNORMAL HIGH (ref 7–232)

## 2014-04-12 LAB — CBC
HCT: 42.7 % (ref 39.0–52.0)
Hemoglobin: 13.9 g/dL (ref 13.0–17.0)
MCH: 28.4 pg (ref 26.0–34.0)
MCHC: 32.6 g/dL (ref 30.0–36.0)
MCV: 87.3 fL (ref 78.0–100.0)
PLATELETS: 256 10*3/uL (ref 150–400)
RBC: 4.89 MIL/uL (ref 4.22–5.81)
RDW: 13.4 % (ref 11.5–15.5)
WBC: 10 10*3/uL (ref 4.0–10.5)

## 2014-04-12 LAB — TROPONIN I: Troponin I: <0.3 ng/mL (ref <0.30)

## 2014-04-12 LAB — UREA NITROGEN, URINE: UREA NITROGEN UR: 625 mg/dL

## 2014-04-12 LAB — LACTIC ACID, PLASMA: LACTIC ACID, VENOUS: 1.3 mmol/L (ref 0.5–2.2)

## 2014-04-12 LAB — POTASSIUM
POTASSIUM: 4.5 meq/L (ref 3.7–5.3)
Potassium: 5.3 mEq/L (ref 3.7–5.3)

## 2014-04-12 LAB — MRSA PCR SCREENING: MRSA by PCR: NEGATIVE

## 2014-04-12 MED ORDER — SODIUM CHLORIDE 0.9 % IV SOLN: INTRAVENOUS | Status: DC

## 2014-04-12 MED ORDER — SODIUM POLYSTYRENE SULFONATE 15 GM/60ML PO SUSP
15.0000 g | Freq: Once | ORAL | Status: AC
  Administered 2014-04-12: 15 g via ORAL
  Filled 2014-04-12: qty 60

## 2014-04-12 NOTE — Progress Notes (Signed)
Patient was discharged home by MD order; discharged instructions  review and give to patient and his mother with care notes and prescriptions; IV DIC; skin intact; patient will be escorted to the car by nurse tech via wheelchair.  

## 2014-04-12 NOTE — Progress Notes (Signed)
Subjective: No complaints today. Feels better. No vomiting since admission, no diarrhea.   Objective: Vital signs in last 24 hours: Filed Vitals:   04/12/14 0800 04/12/14 0830 04/12/14 1214 04/12/14 1217  BP:  131/80 140/93 137/86  Pulse: 95 89 103 99  Temp:      TempSrc:      Resp:  18  18  Height:      Weight:      SpO2:       Weight change:   Intake/Output Summary (Last 24 hours) at 04/12/14 1327 Last data filed at 04/12/14 1132  Gross per 24 hour  Intake    240 ml  Output   2000 ml  Net  -1760 ml   General appearance: alert, cooperative and no distress Lungs: clear to auscultation bilaterally Heart: regular rate and rhythm, S1, S2 normal, no murmur, click, rub or gallop Abdomen: soft, non-tender; bowel sounds normal; no masses,  no organomegaly Extremities: extremities normal, atraumatic, no cyanosis or edema Lab Results: Basic Metabolic Panel:  Recent Labs Lab 04/11/14 1426  04/12/14 0415 04/12/14 0935  NA 139  --  138  --   K 5.2  < > 5.6* 4.5  CL 96  --  98  --   CO2 21  --  24  --   GLUCOSE 136*  --  98  --   BUN 32*  --  24*  --   CREATININE 2.38*  --  1.35  --   CALCIUM 8.7  --  9.3  --   < > = values in this interval not displayed. Liver Function Tests:  Recent Labs Lab 04/11/14 0926  AST 32  ALT 22  ALKPHOS 48  BILITOT 0.6  PROT 9.9*  ALBUMIN 5.5*    Recent Labs Lab 04/11/14 0926  LIPASE 16   No results found for this basename: AMMONIA,  in the last 168 hours CBC:  Recent Labs Lab 04/11/14 0926 04/12/14 0415  WBC 14.8* 10.0  NEUTROABS 13.0*  --   HGB 17.8* 13.9  HCT 52.4* 42.7  MCV 84.7 87.3  PLT 349 256   Cardiac Enzymes:  Recent Labs Lab 04/11/14 1200 04/11/14 2145 04/12/14 0415 04/12/14 0935  CKTOTAL  --   --   --  2708*  TROPONINI <0.30 <0.30 <0.30  --    CBG:  Recent Labs Lab 04/11/14 1525  GLUCAP 157*   Urine Drug Screen: Drugs of Abuse     Component Value Date/Time   LABOPIA NONE DETECTED  08/20/2008 1705   COCAINSCRNUR NONE DETECTED 08/20/2008 1705   LABBENZ NONE DETECTED 08/20/2008 1705   AMPHETMU NONE DETECTED 08/20/2008 1705   THCU NONE DETECTED 08/20/2008 1705   LABBARB  Value: NONE DETECTED        DRUG SCREEN FOR MEDICAL PURPOSES ONLY.  IF CONFIRMATION IS NEEDED FOR ANY PURPOSE, NOTIFY LAB WITHIN 5 DAYS. 08/20/2008 1705    Urinalysis:  Recent Labs Lab 04/11/14 1415  COLORURINE YELLOW  LABSPEC 1.024  PHURINE 5.0  GLUCOSEU NEGATIVE  HGBUR TRACE*  BILIRUBINUR NEGATIVE  KETONESUR 15*  PROTEINUR 100*  UROBILINOGEN 0.2  NITRITE NEGATIVE  LEUKOCYTESUR NEGATIVE   Micro Results: Recent Results (from the past 240 hour(s))  CULTURE, BLOOD (ROUTINE X 2)     Status: None   Collection Time    04/11/14 12:00 PM      Result Value Ref Range Status   Specimen Description BLOOD RIGHT ARM   Final   Special Requests BOTTLES DRAWN  AEROBIC ONLY 10CC   Final   Culture  Setup Time     Final   Value: 04/11/2014 18:54     Performed at [facility]   Culture     Final   Value:        BLOOD CULTURE RECEIVED NO GROWTH TO DATE CULTURE WILL BE HELD FOR 5 DAYS BEFORE ISSUING A FINAL NEGATIVE REPORT     Performed at [facility]   Report Status PENDING   Incomplete  CULTURE, BLOOD (ROUTINE X 2)     Status: None   Collection Time    04/11/14 12:16 PM      Result Value Ref Range Status   Specimen Description BLOOD RIGHT HAND   Final   Special Requests BOTTLES DRAWN AEROBIC ONLY 1.5CC   Final   Culture  Setup Time     Final   Value: 04/11/2014 18:54     Performed at [facility]   Culture     Final   Value:        BLOOD CULTURE RECEIVED NO GROWTH TO DATE CULTURE WILL BE HELD FOR 5 DAYS BEFORE ISSUING A FINAL NEGATIVE REPORT     Performed at [facility]   Report Status PENDING   Incomplete  MRSA PCR SCREENING     Status: None   Collection Time    04/12/14  9:00 AM      Result Value Ref Range Status   MRSA by PCR NEGATIVE  NEGATIVE Final   Comment:             The GeneXpert MRSA Assay (FDA     approved for NASAL specimens     only), is one component of a     comprehensive MRSA colonization     surveillance program. It is not     intended to diagnose MRSA     infection nor to guide or     monitor treatment for     MRSA infections.   Studies/Results: Dg Abd 2 Views  04/11/2014   CLINICAL DATA:  Abdomen pain with nausea and vomiting  EXAM: ABDOMEN - 2 VIEW  COMPARISON:  None.  FINDINGS: Supine and erect views of the abdomen show no bowel obstruction. On the erect view, no free air is seen. No opaque calculi are noted. The bones appear normal.  IMPRESSION: No bowel obstruction.  No free air.   Electronically Signed   By: Dwyane DeePaul  Barry M.D.   On: 04/11/2014 16:09   Medications: I have reviewed the patient's current medications. Scheduled Meds: . heparin subcutaneous  5,000 Units Subcutaneous 3 times per day   Continuous Infusions: . sodium chloride 1,000 mL (04/12/14 0834)   PRN Meds:.morphine injection Assessment/Plan: Principal Problem:   Gastroenteritis, acute Active Problems:   Mild intellectual disabilities   GERD   SEIZURE DISORDER   Hyperkalemia  #1 Rhadomylysis- With hx of prolonged sun exposure-as per pts mother. Cr,  Lactic acid, k elevated, with leukocytosis. Increased Ck- 2780, trending down.  - Cont IVF at 100cc/hr - Repeat K and Ck at 1pm   # Gastroenteritis- Resolving. Abd xray neg for SBO. Pt is tolerating oral diet.  - GI pathogen stool panel pending  # Hyperkalemia: Likely from rhabdomyolysis. K of 6.3, >> 7 on admission. EKG with peaked T waves. Repeat EKG after calcium gluconate, t waves not as elevated. S/p 2 doses of kayexalate. - Repeat- to day- 4.5 X 2 - Monitor with telemetry  -  EKG repeat  #3 Acute Renal Failure: likely prerenal secondary to volume depletion from vomiting and diarrhea. Cr trended down-  2.38>> 1.17 today -IV fluids 100cc/hr.  DVT ppx: Lovenox SQ  FULL CODE   Dispo: Disposition  is deferred at this time, awaiting improvement of current medical problems.  Anticipated discharge in approximately 1-2 day(s).   The patient does have a current PCP Courtney Paris(Eden W Jones, MD) and [facility] after discharge.  The patient does not know have transportation limitations that hinder transportation to clinic appointments.  .Services Needed at time of discharge: Y = Yes, Blank = No PT:   OT:   RN:   Equipment:   Other:     LOS: 1 day   Kennis CarinaEjiroghene Chaundra Abreu, MD 04/12/2014, 1:27 PM

## 2014-04-12 NOTE — H&P (Signed)
INTERNAL MEDICINE TEACHING ATTENDING NOTE  Day 1 of stay  Patient name: Robert Merritt  MRN: [id number] Date of birth: August 14, 1976   38 y.o. man with mild MR coming in with pre-renal AKI and hyperkalemia most likely due to dehydration and heat exhaustion. There is also a question of rhabdomyolysis.  Filed Vitals:   04/12/14 0800 04/12/14 0830 04/12/14 1214 04/12/14 1217  BP:  131/80 140/93 137/86  Pulse: 95 89 103 99  Temp:      TempSrc:      Resp:  18  18  Height:      Weight:      SpO2:         Physical Exam   This morning the gentleman was able to tolerate food with some abdominal discomfort but no vomiting. He had a formed BM this morning.   General: Resting in bed. No acute distress.  HEENT: PERRL, EOMI, no scleral icterus. Heart: RRR, no rubs, murmurs or gallops. Lungs: Clear to auscultation bilaterally, no wheezes, rales, or rhonchi. Abdomen: Soft, mild tenderness - diffuse, nondistended, BS present. Extremities: Warm, no pedal edema. Neuro: Alert and oriented X3. Converses articulately.   Results for Robert Merritt, Robert Merritt (MRN [id number]) as of 04/12/2014 13:14  04/11/2014 09:26 04/11/2014 12:00 04/11/2014 14:26 04/11/2014 21:45 04/12/2014 00:25 04/12/2014 04:15 04/12/2014 09:35  Potassium 6.3 (HH) 7.2 (HH) 5.2 5.1 5.3 5.6 (H) 4.5     Recent Labs Lab 04/11/14 0926 04/11/14 1426 04/12/14 0415  BUN 32* 32* 24*  CREATININE 2.91* 2.38* 1.35    Recent Labs Lab 04/11/14 0926 04/12/14 0415  HGB 17.8* 13.9  HCT 52.4* 42.7  WBC 14.8* 10.0  PLT 349 256    Assessment and Plan    It seems that most of the abnormalities on blood work were secondary to dehydration. His current blood work looks normal with mild elevation of BUN, trending down. He does have elevated CK from today day 1, which was not done at the time of admission. Clinically, he appears improved with some nausea and no vomiting, If he is able to tolerate his food well and his Ck is downtrending, he might be able  to be discharged with early clinic follow up.  I have seen and evaluated this patient and discussed it with my IM resident team.  Please see the rest of the plan per resident note from today.   Shyia Fillingim 04/12/2014, 1:09 PM.

## 2014-04-12 NOTE — Progress Notes (Signed)
Utilization review completed.  

## 2014-04-16 ENCOUNTER — Ambulatory Visit (HOSPITAL_COMMUNITY): Admission: RE | Admit: 2014-04-16 | Payer: Medicare Other | Source: Ambulatory Visit

## 2014-04-16 ENCOUNTER — Ambulatory Visit (INDEPENDENT_AMBULATORY_CARE_PROVIDER_SITE_OTHER): Payer: Medicare Other | Admitting: Internal Medicine

## 2014-04-16 ENCOUNTER — Encounter: Payer: Self-pay | Admitting: Internal Medicine

## 2014-04-16 VITALS — BP 146/91 | HR 72 | Temp 97.2°F | Ht 73.0 in | Wt 139.4 lb

## 2014-04-16 DIAGNOSIS — K529 Noninfective gastroenteritis and colitis, unspecified: Secondary | ICD-10-CM

## 2014-04-16 DIAGNOSIS — IMO0001 Reserved for inherently not codable concepts without codable children: Secondary | ICD-10-CM

## 2014-04-16 DIAGNOSIS — R030 Elevated blood-pressure reading, without diagnosis of hypertension: Secondary | ICD-10-CM

## 2014-04-16 DIAGNOSIS — I10 Essential (primary) hypertension: Secondary | ICD-10-CM | POA: Insufficient documentation

## 2014-04-16 DIAGNOSIS — E875 Hyperkalemia: Secondary | ICD-10-CM

## 2014-04-16 DIAGNOSIS — M6282 Rhabdomyolysis: Secondary | ICD-10-CM | POA: Diagnosis not present

## 2014-04-16 DIAGNOSIS — K5289 Other specified noninfective gastroenteritis and colitis: Secondary | ICD-10-CM | POA: Diagnosis not present

## 2014-04-16 LAB — BASIC METABOLIC PANEL WITH GFR
BUN: 7 mg/dL (ref 6–23)
CALCIUM: 8.9 mg/dL (ref 8.4–10.5)
CO2: 26 mEq/L (ref 19–32)
CREATININE: 0.94 mg/dL (ref 0.50–1.35)
Chloride: 104 mEq/L (ref 96–112)
GFR, Est Non African American: >89 mL/min
GLUCOSE: 95 mg/dL (ref 70–99)
Potassium: 3.8 mEq/L (ref 3.5–5.3)
Sodium: 142 mEq/L (ref 135–145)

## 2014-04-16 LAB — CK: Total CK: 712 U/L — ABNORMAL HIGH (ref 7–232)

## 2014-04-16 NOTE — Discharge Summary (Signed)
Name: Robert BeltonDwight L Merritt MRN: [id number] DOB: 03/31/1976 38 y.o. PCP: Courtney ParisEden W Jones, MD  Date of Admission: 04/11/2014 11:33 AM Date of Discharge: 04/16/2014 Attending Physician: No att. providers found  Discharge Diagnosis:  Principal Problem:   Gastroenteritis, acute Active Problems:   Mild intellectual disabilities   GERD   SEIZURE DISORDER   Hyperkalemia   Rhabdomyolysis  Discharge Medications:   Medication List    Notice   You have not been prescribed any medications.      Disposition and follow-up:   [facility] Merr[facility] Good [facility].  At the hospital follow up visit please address:  1.  Resolution of diarrhea and vomiting.  2.  Labs / imaging needed at time of follow-up: Bmet- K, CK downtrending.  3.  Pending labs/ test needing follow-up: None  Follow-up Appointments: Follow-up Information   Follow up with Genelle GatherGlenn, Kathryn F, MD On 04/16/2014. (At 8.15am.)    Specialty:  Internal Medicine   Contact information:   [address] [place] [address] [phone]       Discharge Instructions: Discharge Instructions   Diet - low sodium heart healthy    Complete by:  As directed      Discharge instructions    Complete by:  As directed   Try to avoid staying under the sun. At least not more than 30mins at a time. Stay hydrated. We have made an appointment for you to follow up with us in clinic- next week Tuesday at 8.15am.     Increase activity slowly    Complete by:  As directed            Consultations:  None.  Procedures Performed:  Dg Abd 2 Views  04/11/2014   CLINICAL DATA:  Abdomen pain with nausea and vomiting  EXAM: ABDOMEN - 2 VIEW  COMPARISON:  None.  FINDINGS: Supine and erect views of the abdomen show no bowel obstruction. On the erect view, no free air is seen. No opaque calculi are noted. The bones appear normal.  IMPRESSION: No bowel obstruction.  No free air.   Electronically Signed   By:  Dwyane DeePaul  Barry M.D.   On: 04/11/2014 16:09    2D Echo: None  Cardiac Cath: None  Admission HPI: Chief Complaint: vomiting and abdominal pain   History of Present Illness: Robert Merritt is a 38 yo with history significant for mental retardation, seizure disorder, GERD and prior peptic ulcer disease who presented to IM Clinic along with his mother with reports of nausea, vomiting, diarrhea, and abdominal pain for the past day. Patient and mother reports that he was not feeling well overall last week but was without specific complaints other than "laying in the bed longer than usual" and increase bowel movements. Over the course of the past day, they state that Robert Merritt started vomiting more than 5 times and was "up all night vomiting". He is complaining of weakness, lightheadedness, sore throat and diffuse abdominal pain which he believes all started at the same time. Denies fever, diaphoresis, chest pain, or shortness of breath but states that he has chills. 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   Physical Exam: General: Well-developed, well-nourished, AA male, in no acute distress mother at bedside; HEENT: Normocephalic, atraumatic, PERRLA, EOMI, dry mucous membrane, supple neck Lungs: Normal respiratory effort. Clear to auscultation bilaterally from apices to bases  without crackles or wheezes appreciated. Heart: slightly tachycardic, regular rhythm, normal S1 and S2, no gallop, murmur, or rubs appreciated. Abdomen: BS normoactive. Soft, Nondistended, mildly tender throughout. No masses or organomegaly appreciated. Extremities: No pretibial edema, distal pulses intact Neurologic: grossly non-focal, alert and oriented x3, appropriate and cooperative throughout examination.  Psych: normal mood and affect, converses well with staff   Hospital Course by problem list:   #1 Rhadomyolysis with hyperkalemia- Likely from prolonged sun  exposure and exertion. Ck elevated on admission- 2708, down trended with IVF hydration to Ck- 2361. K- on admission- 6.3, increased to 7.2, with EKG showing peaked T wave. Pt was given CaCl X1 dose with less prominent T waves, but no old EKGs to compare. Also given D50 with 10u of Insulin-Novolog and nebulized albuterol. Pt was also given Kayexalate X2 doses. On discharge k- 4.5 on 2 Bmets done 4 hrs apart, but EKG findings of peaked T waves persisted despite normalization of Potassium levels. Pt was discharged with close follow up in clinic.  # Gastroenteritis- Resolving. With diarrhea and vomiting. CBC with leukocytosis- 14.8 which resolved on repeat the next day- 10.0.  Dehydration from fluid losses also likely contributing to pts AKI. GI pathogen stool panel- Negative. Pt had no loose bowel movement and vomiting on admission and on discharge. Pt was tolerating oral diet on discharge.  #3 Acute Renal Failure: likely prerenal secondary to volume depletion from vomiting and diarrhea and rhabdomyolysis. With hydration with IVF, Ck 2708 on admission trended down with IVF to 2361. Also Cr trended down- 2.38 on admission to normal of  1.17 on discharge.   Discharge Vitals:   BP 135/86  Pulse 99  Temp(Src) 98.7 F (37.1 C) (Oral)  Resp 18  Ht 5\' 7"  (1.702 m)  Wt 135 lb 4.8 oz (61.372 kg)  BMI 21.19 kg/m2  SpO2 100%  Discharge Labs:  No results found for this or any previous visit (from the past 24 hour(s)).  Signed: Kennis CarinaEjiroghene Emokpae, MD 04/16/2014, 1:03 AM    Services Ordered on Discharge: None Equipment Ordered on Discharge: None.

## 2014-04-16 NOTE — Progress Notes (Signed)
Case discussed with Dr. Glenn soon after the resident saw the patient.  We reviewed the resident's history and exam and pertinent patient test results.  I agree with the assessment, diagnosis, and plan of care documented in the resident's note. 

## 2014-04-16 NOTE — Assessment & Plan Note (Addendum)
His blood pressure is slightly elevated today at 146/91. He seems somewhat nervous on exam. Will have him return in 3 months for a blood pressure check.

## 2014-04-16 NOTE — Assessment & Plan Note (Signed)
No further abdominal pain, nausea, vomiting, or diarrhea. Pt is staying hydrated. GI pathogen stool panel was negative Symptoms likely related to viral illness.

## 2014-04-16 NOTE — Progress Notes (Signed)
Patient ID: Robert Merritt, male   DOB: May 17, 1976, 38 y.o.   MRN: [id number]  Subjective:   Patient ID: Robert Merritt male   DOB: May 17, 1976 38 y.o.   MRN: [id number]  HPI: [name] is a 38 y.o. M w/ PMH seizure d/o and GERD presents for a hospital f/u.   He was admitted on 6/25 with gastroenteritis, AKI, hyperkalemia, and rhabdomyolysis.   CK 2708 on admission which down trended to 2361 with IVF and was attributed to prolonged sun exposure and exertion in the setting of an AKI which was 2/2 to volume depletion in the setting of gastroenteritis, also likely worsened by the rhabdomyolysis. He was also found to be hyperkalemic to 6.3, which increased to 7.2 on recheck and EKG showed peaked T waves. He was treated for his hyperkalemia with CaCl, D50, insulin, albuterol, and Kayexalate. The peaked T-waves persisted despite normalization of his potassium levels.   Today he states that he is feeling much better. He denies any further abdominal pain, nausea, vomiting, or diarrhea. He states that he is drinking a lot of water and is voiding normally and his urine is clear.   He denies nay fevers, chills, headaches, vision changes, chest pain, palpitations, SOB, or peripheral edema.   Past Medical History  Diagnosis Date  . GERD (gastroesophageal reflux disease)   . PUD (peptic ulcer disease)     with h/o H.pylori infection  . Hemorrhoid   . Mental retardation, mild (I.Q. 50-70)   . Cellulitis     right leg  . Myalgia     intermittent lower extremity  . Seizures     not on medication. h/o grand mal seizures, last episode in 1995  . Inguinal lymphadenopathy   . Fatigue 06/2010    TSH low normal, Hb wnl, CMP wnl  . Groin pain     Chronic. S/p R LN aspiration culture which was negative in 2009.  . Family history of anesthesia complication     grandmother had hard time waking   No current outpatient prescriptions on file.   No current facility-administered medications for this  visit.   Family History  Problem Relation Age of Onset  . Sleep apnea Mother   . Diabetes Maternal Grandmother   . Sickle cell anemia Brother     Half brother died of complications of sickle cell anemia at age 40  . Kidney failure Maternal Grandmother    History   Social History  . Marital Status: Single    Spouse Name: N/A    Number of Children: N/A  . Years of Education: N/A   Occupational History  . works at [employer]    Social History Main Topics  . Smoking status: Never Smoker   . Smokeless tobacco: Never Used  . Alcohol Use: No  . Drug Use: No  . Sexual Activity: Not on file   Other Topics Concern  . Not on file   Social History Narrative   Lives at home with mother.   Mild mental retardation.   A special Olympics Olympian.   Works at [employer]. 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    Denies alcohol, tobacco, or drug use.   Has never been sexually active.   Review of Systems: Constitutional: Denies fever, chills, appetite change, or fatigue.  HEENT: +rhinorrhea, clear Respiratory: Denies SOB, DOE  Cardiovascular: Denies chest pain, palpitations, or leg swelling.  Gastrointestinal: Denies nausea, vomiting, abdominal pain, diarrhea, constipation Genitourinary: Denies dysuria, urgency, frequency  Musculoskeletal: Denies pain or gait problem.  Skin: Denies wounds.  Neurological: Denies dizziness, weakness, or headaches.  Psychiatric/Behavioral: Denies mood changes  Objective:  Physical Exam: There were no vitals filed for this visit. Constitutional: Vital signs reviewed.  Patient is a well-developed and well-nourished male in no acute distress and cooperative with exam. Head: Normocephalic and atraumatic Mouth: no erythema or exudates, MMM Eyes: PERRL, EOMI. No scleral icterus.  Neck: Supple, Trachea midline, normal ROM Cardiovascular: RRR, no MRG, pulses symmetric and intact bilaterally Pulmonary/Chest: Normal respiratory effort, CTAB Abdominal: Soft.  Non-tender, non-distended, bowel sounds are normal, no masses, organomegaly, or guarding present.  Musculoskeletal: No joint deformities, erythema, or stiffness Hematology: No cervical adenopathy.  Neurological: A&O x3, Strength is normal and symmetric bilaterally, cranial nerve II-XII are grossly intact, no focal motor deficit. Skin: Warm, dry and intact.  Psychiatric: Normal mood and affect. speech and behavior is normal.  Assessment & Plan:   Please refer to Problem List based Assessment and Plan

## 2014-04-16 NOTE — Assessment & Plan Note (Addendum)
CK >2700 on admission and down trended to 2361 with IVF. Will recheck CK today to make sure it continues to trend downward.   CK down to 712 today. Continued to encourage good water consumption and staying hydrated.

## 2014-04-16 NOTE — Assessment & Plan Note (Addendum)
Potassium was as high as 7.2 this admission, which was attributed to rhabdomyolysis He was treated with Kayexalate, insulin, D50, and albuterol and his potassium returned to normal at 4.5 on the day of discharge. Will repeat BMP today to assess potassium and check an EKG to assess T-waves as they have remained peaked despite normalization of his potassium.    -EKG still with peaked T-waves that are slightly improved in V3, V5-6. Slightly increased in V4. Pt w/o chest pain or palpitations.  - Potassium 3.8 today

## 2014-04-17 LAB — CULTURE, BLOOD (ROUTINE X 2)
Culture: NO GROWTH
Culture: NO GROWTH

## 2014-04-18 NOTE — Discharge Summary (Signed)
I discussed the plan of care with my resident team. I have reviewed the discharge documentation, and I agree with it. 

## 2014-07-23 ENCOUNTER — Ambulatory Visit (INDEPENDENT_AMBULATORY_CARE_PROVIDER_SITE_OTHER): Payer: Medicare Other

## 2014-07-23 DIAGNOSIS — Z23 Encounter for immunization: Secondary | ICD-10-CM | POA: Diagnosis not present

## 2015-04-25 ENCOUNTER — Encounter: Payer: Self-pay | Admitting: Internal Medicine

## 2015-06-02 ENCOUNTER — Encounter: Payer: Self-pay | Admitting: Internal Medicine

## 2015-06-02 ENCOUNTER — Ambulatory Visit (INDEPENDENT_AMBULATORY_CARE_PROVIDER_SITE_OTHER): Payer: Medicare Other | Admitting: Internal Medicine

## 2015-06-02 VITALS — BP 123/87 | HR 64 | Temp 98.2°F | Ht 69.0 in | Wt 140.3 lb

## 2015-06-02 DIAGNOSIS — E875 Hyperkalemia: Secondary | ICD-10-CM | POA: Diagnosis not present

## 2015-06-02 DIAGNOSIS — IMO0001 Reserved for inherently not codable concepts without codable children: Secondary | ICD-10-CM

## 2015-06-02 DIAGNOSIS — R1031 Right lower quadrant pain: Secondary | ICD-10-CM

## 2015-06-02 DIAGNOSIS — R030 Elevated blood-pressure reading, without diagnosis of hypertension: Secondary | ICD-10-CM | POA: Diagnosis not present

## 2015-06-02 NOTE — Progress Notes (Signed)
Patient ID: BRITT PETRONI, male   DOB: 01/07/1976, 39 y.o.   MRN: [id number]   Subjective:   Patient ID: MACKLEN WILHOITE male   DOB: 01/08/1976 39 y.o.   MRN: [id number]  HPI: Mr.Adonnis L Tones is a 39 y.o. male with a PMH of mental retardation, GERD, remote history of seizures not requiring antiepileptic therapy presenting to clinic today for annual visit.   Patient has complaints of right groin pain. This has been going on for >3 months. He reports that the pain is constant 3/10 and sharp/stabbing in nature. He says that the pain gets worse throughout the day when he is at work. 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 It also bothers him at night. He reports he has been putting rubbing alcohol on the area at night which seems to help relieve some of the pain. Does note that there was a knot in the area at some point but is not present today. Has reported right groin pain in the past with an [place] in 2009 noting enlargement of the inguinal lymph nodes.   He was hospitalized in June of 2015 for Rhabdomyolysis with hyperkalemia. EKGs done then showed peaked T waves. Never followed up with EKG on clinic visit.   Past Medical History  Diagnosis Date  . GERD (gastroesophageal reflux disease)   . PUD (peptic ulcer disease)     with h/o H.pylori infection  . Hemorrhoid   . Mental retardation, mild (I.Q. 50-70)   . Cellulitis     right leg  . Myalgia     intermittent lower extremity  . Seizures     not on medication. h/o grand mal seizures, last episode in 1995  . Inguinal lymphadenopathy   . Fatigue 06/2010    TSH low normal, Hb wnl, CMP wnl  . Groin pain     Chronic. S/p R LN aspiration culture which was negative in 2009.  . Family history of anesthesia complication     grandmother had hard time waking   No current outpatient prescriptions on file.   No current facility-administered medications for this visit.   Family History  Problem Relation  Age of Onset  . Sleep apnea Mother   . Diabetes Maternal Grandmother   . Sickle cell anemia Brother     Half brother died of complications of sickle cell anemia at age 63  . Kidney failure Maternal Grandmother    Social History   Social History  . Marital Status: Single    Spouse Name: N/A  . Number of Children: N/A  . Years of Education: N/A   Occupational History  . works at [employer]    Social History Main Topics  . Smoking status: Never Smoker   . Smokeless tobacco: Never Used  . Alcohol Use: No  . Drug Use: No  . Sexual Activity: Not Asked   Other Topics Concern  . None   Social History Narrative   Lives at home with mother.   Mild mental retardation.   A special Olympics Olympian.   Works at [employer]. 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    Denies alcohol, tobacco, or drug use.   Has never been sexually active.   Review of Systems: Review of Systems  Constitutional: Negative for fever, chills and weight loss.  Respiratory: Negative for cough and shortness of breath.   Cardiovascular: Negative for chest pain and palpitations.  Gastrointestinal: Negative for nausea, vomiting,  abdominal pain, diarrhea and constipation.  Genitourinary: Negative for dysuria.   Objective:  Physical Exam: Filed Vitals:   06/02/15 0918  BP: 123/87  Pulse: 64  Temp: 98.2 F (36.8 C)  TempSrc: Oral  Height: 5\' 9"  (1.753 m)  Weight: 140 lb 4.8 oz (63.64 kg)  SpO2: 100%   Physical Exam Genaerl: Vital signs reviewed. Patient is a well-developed and well-nourished male in no acute distress and cooperative with exam. Alert and oriented x3.  Head: Normocephalic and atraumatic Mouth: no erythema or exudates, MMM Eyes: PERRL, EOMI, conjunctivae normal, No scleral icterus.  Neck: Supple, Trachea midline normal ROM, No JVD, mass, thyromegaly  Cardiovascular: RRR, S1 normal, S2 normal, no MRG, pulses symmetric and intact bilaterally Pulmonary/Chest: CTAB, no wheezes, rales, or  rhonchi Abdominal: Soft. Non-tender, non-distended, bowel sounds are normal, no masses, organomegaly, or guarding present.  GU: no CVA tenderness, negative for inguinal hernia Musculoskeletal: ROM full and no nontender Neurological: A&O x3, Strength is normal and symmetric bilaterally, cranial nerve II-XII are grossly intact, no focal motor deficit, sensory intact to light touch bilaterally.  Skin: Warm, dry and intact. No rash, cyanosis, or clubbing.  Psychiatric: mood and affect congruent and upbeat.  Assessment & Plan:  Case discussed with Dr. Rogelia Boga. Please refer to Problem based carting for further details of today's visit.

## 2015-06-02 NOTE — Assessment & Plan Note (Signed)
Will recheck BMP today. Last EKG with persistent peaked-T waves despite improvement in K to 3.8. Never had follow up EKG done. Will order repeat EKG today. Patient is unable to have it done today, will schedule nursing only appointment to have EKG done.

## 2015-06-02 NOTE — Assessment & Plan Note (Signed)
Patient has complaints of right groin pain. This has been going on for >3 months. He reports that the pain is constant 3/10 and sharp/stabbing in nature. He says that the pain gets worse throughout the day when he is at work. 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 It also bothers him at night. He reports he has been putting rubbing alcohol on the area at night which seems to help relieve some of the pain. Does note that there was a knot in the area at some point but is not present today. Denies any skin changes. Has reported right groin pain in the past with an [place] in 2009 noting enlargement of the inguinal lymph nodes.   Denis any fevers, chills, abdominal pain, N/V/D or constipation. Has regular bowel movements daily.   No inguinal hernia on exam, not tender to palpation, no skin changes.   - Will get Xray of the hip  - Ibuprofen for pain as needed - F/u if pain gets worse or X-rays show anything

## 2015-06-02 NOTE — Progress Notes (Signed)
Internal Medicine Clinic Attending  I saw and evaluated the patient.  I personally confirmed the key portions of the history and exam documented by Dr. Karma Greaser and I reviewed pertinent patient test results.  The assessment, diagnosis, and plan were formulated together and I agree with the documentation in the resident's note. Negative pain R hip on internal rotation. Pain all day an worse with standing / walking at work. Agree wiethe Dr Bonney Roussel plans.

## 2015-06-02 NOTE — Patient Instructions (Signed)
Thank you for coming in today  - We will get some lab work done today and call you if there are any problems, otherwise if you don't hear from [place] everything is good - Please come back for your EKG and Xray - If the groin pain worsens or if you develop any fevers, chillls, please call the clinic - If no other problems, please come back in one year for yearly check up

## 2015-06-02 NOTE — Assessment & Plan Note (Signed)
Blood pressure 123/87 today. Will follow up in 1 year for annual check up or prn.

## 2015-06-03 LAB — BASIC METABOLIC PANEL
BUN / CREAT RATIO: 11 (ref 9–20)
BUN: 9 mg/dL (ref 6–24)
CO2: 22 mmol/L (ref 18–29)
CREATININE: 0.85 mg/dL (ref 0.76–1.27)
Calcium: 9.4 mg/dL (ref 8.7–10.2)
Chloride: 100 mmol/L (ref 97–108)
GFR calc non Af Amer: 108 mL/min/{1.73_m2} (ref >59)
GFR, EST AFRICAN AMERICAN: 125 mL/min/{1.73_m2} (ref >59)
Glucose: 93 mg/dL (ref 65–99)
Potassium: 4.6 mmol/L (ref 3.5–5.2)
SODIUM: 141 mmol/L (ref 134–144)

## 2015-06-04 ENCOUNTER — Telehealth: Payer: Self-pay | Admitting: Internal Medicine

## 2015-06-04 NOTE — Telephone Encounter (Signed)
Call to patient to confirm appointment for 06/05/15 at 9:30 home number is disconnected mobile is wrong number

## 2015-06-05 ENCOUNTER — Other Ambulatory Visit: Payer: Self-pay | Admitting: Internal Medicine

## 2015-06-05 ENCOUNTER — Ambulatory Visit (HOSPITAL_COMMUNITY)
Admission: RE | Admit: 2015-06-05 | Discharge: 2015-06-05 | Disposition: A | Discharge status: Home / Self Care | Payer: Medicare Other | Source: Ambulatory Visit | Attending: Internal Medicine | Admitting: Internal Medicine

## 2015-06-05 ENCOUNTER — Ambulatory Visit: Payer: Medicare Other | Admitting: *Deleted

## 2015-06-05 DIAGNOSIS — M25551 Pain in right hip: Secondary | ICD-10-CM | POA: Diagnosis not present

## 2015-06-05 DIAGNOSIS — R1031 Right lower quadrant pain: Secondary | ICD-10-CM

## 2015-06-05 DIAGNOSIS — R9431 Abnormal electrocardiogram [ECG] [EKG]: Secondary | ICD-10-CM

## 2015-06-05 DIAGNOSIS — R937 Abnormal findings on diagnostic imaging of other parts of musculoskeletal system: Secondary | ICD-10-CM | POA: Insufficient documentation

## 2015-06-05 DIAGNOSIS — M1611 Unilateral primary osteoarthritis, right hip: Secondary | ICD-10-CM | POA: Diagnosis not present

## 2015-06-05 NOTE — Addendum Note (Signed)
Addended by: Hollie Salk on: 06/05/2015 12:26 PM   Modules accepted: Orders

## 2015-06-05 NOTE — Progress Notes (Signed)
EKG done as ordered.  Shown to Dr. Karma Greaser, he states okay for pt to leave.

## 2015-07-09 ENCOUNTER — Ambulatory Visit: Payer: Medicare Other

## 2015-07-31 ENCOUNTER — Encounter: Payer: Self-pay | Admitting: Internal Medicine

## 2015-07-31 ENCOUNTER — Ambulatory Visit (INDEPENDENT_AMBULATORY_CARE_PROVIDER_SITE_OTHER): Payer: Medicare Other | Admitting: Internal Medicine

## 2015-07-31 VITALS — BP 157/97 | HR 81 | Temp 97.5°F | Ht 69.0 in | Wt 144.6 lb

## 2015-07-31 DIAGNOSIS — S6992XA Unspecified injury of left wrist, hand and finger(s), initial encounter: Secondary | ICD-10-CM

## 2015-07-31 DIAGNOSIS — B369 Superficial mycosis, unspecified: Secondary | ICD-10-CM

## 2015-07-31 DIAGNOSIS — Z23 Encounter for immunization: Secondary | ICD-10-CM

## 2015-07-31 DIAGNOSIS — S6990XA Unspecified injury of unspecified wrist, hand and finger(s), initial encounter: Secondary | ICD-10-CM | POA: Insufficient documentation

## 2015-07-31 MED ORDER — TERBINAFINE HCL 1 % EX CREA: 1.0000 "application " | TOPICAL_CREAM | Freq: Two times a day (BID) | CUTANEOUS | Status: DC

## 2015-07-31 MED ORDER — BACITRACIN ZINC 500 UNIT/GM EX OINT: 1.0000 "application " | TOPICAL_OINTMENT | Freq: Two times a day (BID) | CUTANEOUS | Status: DC

## 2015-07-31 NOTE — Assessment & Plan Note (Signed)
He likely injured his finger without realizing it. The wound is small, superficial, and non-purulent exam without evidence of joint or bone involvement.  - Provided Bacitracin ointment packets and band aids, and informed him he could pickup more ant [facility] (walking distance from home) if he runs out. - Told to call the clinic if it is worsening

## 2015-07-31 NOTE — Progress Notes (Signed)
   Subjective:    Patient ID: Robert Merritt, male    DOB: 1976/05/03, 39 y.o.   MRN: [id number]  HPI  Robert Merritt is a 39 year old man with a PMH of mild intellectual disability who comes to the clinic today for a facial rash and cut finger. He is not sure when the rash came on, but he thinks it might be a month ago. It is non-painful, non-pruritic and occupies both sides of the face. He has never had a rash like this before and is not sure what may have caused it. The cut occurred this morning, although he is not sure where it came from. He just noticed that his left finger was bleeding when he put on his shirt, and his mother wrapped a rag around it. The bleeding has since stopped. He denies any fever, chills, or drainage from his finger. He denies any joint pain. He does not have any known bleeding disorder.  Review of Systems  Constitutional: Negative for fever and chills.  HENT: Negative for sore throat.   Respiratory: Negative for cough and shortness of breath.   Cardiovascular: Negative for chest pain and leg swelling.  Gastrointestinal: Negative for nausea, vomiting and diarrhea.  Endocrine: Negative for cold intolerance.  Genitourinary: Negative for dysuria.  Musculoskeletal: Negative for joint swelling.  Skin: Positive for wound.  Allergic/Immunologic: Negative for environmental allergies.  Neurological: Negative for headaches.  Hematological: Positive for adenopathy.  Psychiatric/Behavioral: Negative for dysphoric mood.       Objective:   Physical Exam  Constitutional: He appears well-developed and well-nourished. No distress.  HENT:  Head: Atraumatic.  Mouth/Throat: Oropharynx is clear and moist. No oropharyngeal exudate.  Eyes: EOM are normal. Pupils are equal, round, and reactive to light.  Cardiovascular: Normal rate, regular rhythm and normal heart sounds.   Pulmonary/Chest: Effort normal and breath sounds normal. No respiratory distress.  Abdominal: Soft. Bowel  sounds are normal. He exhibits no distension.  Musculoskeletal:  Left index finger has full range of motion without joint pain or swelling.  Lymphadenopathy:    He has no cervical adenopathy.  Neurological: He is alert. He has normal reflexes.  Skin:  Scaly, non-erythematous hypopigmented lesions on both sides of face with reduced facial hair growth in those areas. 1 cm superficial skin wound on left index finger with no sign of purulent drainage.  Psychiatric: He has a normal mood and affect.  Vitals reviewed.         Assessment & Plan:  Please see problem-based assessment and plan for details.

## 2015-07-31 NOTE — Assessment & Plan Note (Signed)
Differential includes superficial fungal infection versus pityriasis alba. Will treat as fungal infection. - Terbinafine cream - If it worsens, will transition to cortisone cream for treatment of pityriasis alba

## 2015-07-31 NOTE — Patient Instructions (Signed)
Mr. Joanne GavelSutton, it was a pleasure to meet you today. For your finger you can apply bacitracin ointment and use the bandaids I gave you. You can ask your mom for help. For the white patches on your face, apply the antifungal cream we sent to your pharmacy. If it continues to worsen, have your mom call the clinic, and we can change the treatment. If you finger continues to worsen, or if it looks infected (looks white or yellow and is more painful), please call the clinic. Have a great day.

## 2015-08-04 NOTE — Progress Notes (Signed)
Internal Medicine Clinic Attending  I saw and evaluated the patient.  I personally confirmed the key portions of the history and exam documented by Dr. Ford and I reviewed pertinent patient test results.  The assessment, diagnosis, and plan were formulated together and I agree with the documentation in the resident's note. 

## 2016-03-29 ENCOUNTER — Encounter: Payer: Self-pay | Admitting: *Deleted

## 2016-04-28 ENCOUNTER — Ambulatory Visit: Payer: Medicare Other

## 2016-05-05 ENCOUNTER — Ambulatory Visit (INDEPENDENT_AMBULATORY_CARE_PROVIDER_SITE_OTHER): Payer: Medicare Other | Admitting: Internal Medicine

## 2016-05-05 ENCOUNTER — Encounter: Payer: Self-pay | Admitting: Internal Medicine

## 2016-05-05 VITALS — BP 139/91 | HR 66 | Temp 97.9°F | Ht 66.0 in | Wt 139.0 lb

## 2016-05-05 DIAGNOSIS — B369 Superficial mycosis, unspecified: Secondary | ICD-10-CM | POA: Diagnosis present

## 2016-05-05 NOTE — Progress Notes (Signed)
   Subjective:    Patient ID: Robert Merritt, male    DOB: 02/07/1976, 40 y.o.   MRN: [id number]  HPI Robert Merritt is a 40 year old male who presents today for possible bedbug bites. Please see assessment & plan for status of chronic medical problems.    Review of Systems     Objective:   Physical Exam  Constitutional: He appears well-developed and well-nourished.  HENT:  Head: Normocephalic and atraumatic.  Eyes: Conjunctivae are normal. No scleral icterus.  Pulmonary/Chest: Effort normal. No respiratory distress.  Skin:  No punctate lesions noted with surrounding erythema on all 4 extremities, chest, back, abdomen, neck. Groin was not inspected per patient preference. Mild hypopigmentation noted at the angle of the jaw bilaterally, worse on the left.          Assessment & Plan:

## 2016-05-05 NOTE — Assessment & Plan Note (Signed)
Assessment His mother brought to attention skin lesions noted at the angle of the jaw bilaterally, worse on his left. She notes that they are particularly exquisite to visualization after he shaves. He denies any itching or other areas which may have been affected.  Physical exam findings with some hypopigmentation raises concern for fungal infection. Unable to assess whether or not he tried terbinafine cream which was prescribed to him at his last visit since this problem was brought to my attention as they were leaving.  Plan -Reassess at follow-up visit

## 2016-05-07 NOTE — Progress Notes (Signed)
Internal Medicine Clinic Attending  Case discussed with Dr. Patel,Rushil soon after the resident saw the patient.  We reviewed the resident's history and exam and pertinent patient test results.  I agree with the assessment, diagnosis, and plan of care documented in the resident's note. 

## 2016-06-17 ENCOUNTER — Encounter: Payer: Self-pay | Admitting: Internal Medicine

## 2016-06-17 ENCOUNTER — Ambulatory Visit (INDEPENDENT_AMBULATORY_CARE_PROVIDER_SITE_OTHER): Payer: Medicare Other | Admitting: Internal Medicine

## 2016-06-17 VITALS — BP 192/88 | HR 69 | Temp 97.6°F | Ht 66.0 in | Wt 139.7 lb

## 2016-06-17 DIAGNOSIS — B369 Superficial mycosis, unspecified: Secondary | ICD-10-CM

## 2016-06-17 NOTE — Assessment & Plan Note (Addendum)
Patient is here for continuing rash on his lower part of the face. It has been there for about a year now. He was initially prescribed terbinafine cream and has been using it but it has not helped. It was thought that it could be superficial fungal infection vs pityriasis alba. He described the rash as non pruritic , and he has not used new lotions, scents, and does not shave there. On exam, hypopigmented macules bilaterally on the mandible.  Plan -referral to dermatology -hydrocortisone likely may make the hypopigmentation worse so advised pt not to use steroid cream until being seem by dermatology

## 2016-06-17 NOTE — Patient Instructions (Signed)
Thank you for your visit today  Please follow up with the skin doctor. Someone will call you to make an appointment.

## 2016-06-17 NOTE — Progress Notes (Signed)
   CC: rash on his face HPI: Robert Merritt is a 40 y.o. man with PMH noted below here for continuing rash on his face  Please see Problem List/A&P for the status of the patient's chronic medical problems   Past Medical History:  Diagnosis Date  . Cellulitis    right leg  . Family history of anesthesia complication    grandmother had hard time waking  . Fatigue 06/2010   TSH low normal, Hb wnl, CMP wnl  . GERD (gastroesophageal reflux disease)   . Groin pain    Chronic. S/p R LN aspiration culture which was negative in 2009.  Marland Kitchen. Hemorrhoid   . Inguinal lymphadenopathy   . Mental retardation, mild (I.Q. 50-70)   . Myalgia    intermittent lower extremity  . PUD (peptic ulcer disease)    with h/o H.pylori infection  . Seizures (HCC)    not on medication. h/o grand mal seizures, last episode in 1995    Review of Systems: Denies fevers chills, fatigue Denies shaving on the site of rash, or other creams or lotions. Denies pruritus   Physical Exam: Vitals:   06/17/16 1105  BP: (!) 192/88  Pulse: 69  Temp: 97.6 F (36.4 C)  TempSrc: Oral  SpO2: 100%  Weight: 139 lb 11.2 oz (63.4 kg)  Height: 5\' 6"  (1.676 m)    General: A&O, in NAD CV: RRR, normal s1, s2, no m/r/g,  Resp: equal and symmetric breath sounds, no wheezing or crackles  Skin:  Hypopigmented patches on both mandibles , nonpruritic    Assessment & Plan:   See encounters tab for problem based medical decision making. Patient discussed with Dr. Josem KaufmannKlima

## 2016-06-18 NOTE — Progress Notes (Signed)
Patient ID: Robert BeltonDwight L Merritt, male   DOB: 05-May-1976, 40 y.o.   MRN: [id number]  I saw and evaluated the patient.  I personally confirmed the key portions of Dr. Bo MerinoSaraiya's history and exam and reviewed pertinent patient test results.  The assessment, diagnosis, and plan were formulated together and I agree with the documentation in the resident's note.

## 2016-08-04 DIAGNOSIS — Z23 Encounter for immunization: Secondary | ICD-10-CM | POA: Diagnosis not present

## 2016-08-23 NOTE — Addendum Note (Signed)
Addended by: Neomia DearPOWERS, Daya Dutt E on: 08/23/2016 09:25 AM   Modules accepted: Orders

## 2016-10-06 ENCOUNTER — Encounter (INDEPENDENT_AMBULATORY_CARE_PROVIDER_SITE_OTHER): Payer: Self-pay

## 2016-10-06 ENCOUNTER — Ambulatory Visit (INDEPENDENT_AMBULATORY_CARE_PROVIDER_SITE_OTHER): Payer: Medicare Other | Admitting: Internal Medicine

## 2016-10-06 VITALS — BP 152/88 | HR 73 | Temp 98.5°F | Ht 66.0 in | Wt 143.2 lb

## 2016-10-06 DIAGNOSIS — R220 Localized swelling, mass and lump, head: Secondary | ICD-10-CM | POA: Diagnosis not present

## 2016-10-06 NOTE — Patient Instructions (Addendum)
We think your swelling may be from a simple bug bite and should get better on it's own.  You can try warm compresses, ice packs, and 1 to 2 doses of Benadryl to help with with swelling.   Any fevers, pain, or worsening swelling please return to the clinic.   If no better by Friday please return to the clinic and we can get some imaging.  Take care and happy holidays!

## 2016-10-06 NOTE — Assessment & Plan Note (Addendum)
Developed painless right lower facial swelling yesterday evening. Swelling was possibly worse this AM prior to visit but now no longer involves his left face. The swelling is non-painful, non-pruritic, does not involve teeth, gums, or tongue. He cannot recall any bug bite but there  Is an ongoing bed bug infestation at home and he has swelled with bee stings in the past. No fevers/chills, difficulty eating, swallowing, breathing, no new medications or new foods, no trauma or injury. Completely benign exam, no evidence of infection or perceptible bite or injury.  Assessment: Right facial swelling of unclear etiology, possible undetected insect bite yesterday, seems to be self-resolving  Plan: - Advised to try warm or cold compresses and 1-2 doses of Benadryl - Advised to return if develops any pain, fever, or worsening swelling - and to go to ER if at any point develops difficulty breathing - Advised to return to clinic on Friday if no improvement by then

## 2016-10-06 NOTE — Progress Notes (Signed)
   CC: Face swelling  HPI:  Robert Merritt is a 40 y.o. male with PMHx detailed below presenting with painless right lower facial swelling that began last night. His mother reports the swelling looked worse this morning and seemed to involve the left side of his face, but it has come down by the time of this visit. The swelling is non-painful, non-pruritic, does not involve teeth, gums, or tongue. He cannot recall any bug bite but his mother reports they have an ongoing bed bug infestation at home with exterminator treatment pending. He has developed profound swelling from bee stings in the past and has allergies. He denies any fevers/chills, difficulty eating, swallowing, breathing. He has taken no new medications or tried any new foods. He denies trauma to the location, and has no new issues with tooth pain.   See problem based assessment and plan below for additional details.  Past Medical History:  Diagnosis Date  . Cellulitis    right leg  . Family history of anesthesia complication    grandmother had hard time waking  . Fatigue 06/2010   TSH low normal, Hb wnl, CMP wnl  . GERD (gastroesophageal reflux disease)   . Groin pain    Chronic. S/p R LN aspiration culture which was negative in 2009.  Marland Kitchen. Hemorrhoid   . Inguinal lymphadenopathy   . Mental retardation, mild (I.Q. 50-70)   . Myalgia    intermittent lower extremity  . PUD (peptic ulcer disease)    with h/o H.pylori infection  . Seizures (HCC)    not on medication. h/o grand mal seizures, last episode in 1995    Review of Systems: Review of Systems  Constitutional: Negative for chills, fever, malaise/fatigue and weight loss.  HENT: Negative for congestion, hearing loss, sinus pain and sore throat.   Eyes: Negative for blurred vision and pain.  Respiratory: Positive for cough (dry). Negative for sputum production, shortness of breath and wheezing.   Cardiovascular: Negative for chest pain and palpitations.    Gastrointestinal: Negative for abdominal pain, nausea and vomiting.  Skin: Negative for itching and rash.  Neurological: Negative for tingling, sensory change and headaches.  All other systems reviewed and are negative.  Physical Exam: Vitals:   10/06/16 1014  BP: (!) 152/88  Pulse: 73  Temp: 98.5 F (36.9 C)  TempSrc: Oral  SpO2: 100%  Weight: 143 lb 3.2 oz (65 kg)  Height: 5\' 6"  (1.676 m)   Body mass index is 23.11 kg/m. GENERAL- Casually-dressed AA gentleman sitting comfortably in exam room chair, alert, in no distress, quiet and soft spoken HEENT- Moderate soft tissue swelling of right lower face involving cheek and ending below the right eye, no bruising, erythema, warmth, no cuts or bite marks, oropharynx clear with normal appearing gums and tongue, teeth with numerous fillings, throat clear, eyes PERRL, EOMI, neck supple no lymphadenopathy CARDIAC- Regular rate and rhythm, no murmurs, rubs or gallops. RESP- Clear to ascultation bilaterally, no wheezing or crackles, normal work of breathing ABDOMEN- Soft, nontender, nondistended EXTREMITIES- Normal bulk and range of motion, no edema, 2+ peripheral pulses SKIN- Warm, dry, intact, without visible rash PSYCH- Appropriate affect, clear speech, thoughts linear and goal-directed  Assessment & Plan:   See encounters tab for problem based medical decision making.  Patient seen with Dr. Heide SparkNarendra

## 2016-10-07 NOTE — Progress Notes (Signed)
Internal Medicine Clinic Attending  I saw and evaluated the patient.  I personally confirmed the key portions of the history and exam documented by Dr. Johnson and I reviewed pertinent patient test results.  The assessment, diagnosis, and plan were formulated together and I agree with the documentation in the resident's note.  

## 2016-11-20 ENCOUNTER — Emergency Department (HOSPITAL_COMMUNITY): Payer: Medicare Other

## 2016-11-20 ENCOUNTER — Encounter (HOSPITAL_COMMUNITY): Payer: Self-pay

## 2016-11-20 ENCOUNTER — Emergency Department (HOSPITAL_COMMUNITY)
Admission: EM | Admit: 2016-11-20 | Discharge: 2016-11-20 | Disposition: A | Discharge status: Home / Self Care | Payer: Medicare Other | Attending: Emergency Medicine | Admitting: Emergency Medicine

## 2016-11-20 DIAGNOSIS — R69 Illness, unspecified: Secondary | ICD-10-CM

## 2016-11-20 DIAGNOSIS — J069 Acute upper respiratory infection, unspecified: Secondary | ICD-10-CM | POA: Diagnosis present

## 2016-11-20 DIAGNOSIS — R7989 Other specified abnormal findings of blood chemistry: Secondary | ICD-10-CM | POA: Diagnosis not present

## 2016-11-20 DIAGNOSIS — R05 Cough: Secondary | ICD-10-CM | POA: Diagnosis not present

## 2016-11-20 DIAGNOSIS — J111 Influenza due to unidentified influenza virus with other respiratory manifestations: Secondary | ICD-10-CM | POA: Insufficient documentation

## 2016-11-20 LAB — COMPREHENSIVE METABOLIC PANEL
ALT: 20 U/L (ref 17–63)
ANION GAP: 16 — AB (ref 5–15)
AST: 38 U/L (ref 15–41)
Albumin: 5.3 g/dL — ABNORMAL HIGH (ref 3.5–5.0)
Alkaline Phosphatase: 40 U/L (ref 38–126)
BUN: 31 mg/dL — ABNORMAL HIGH (ref 6–20)
CALCIUM: 10.2 mg/dL (ref 8.9–10.3)
CHLORIDE: 92 mmol/L — AB (ref 101–111)
CO2: 26 mmol/L (ref 22–32)
CREATININE: 1.63 mg/dL — AB (ref 0.61–1.24)
GFR, EST AFRICAN AMERICAN: 59 mL/min — AB (ref >60)
GFR, EST NON AFRICAN AMERICAN: 50 mL/min — AB (ref >60)
Glucose, Bld: 104 mg/dL — ABNORMAL HIGH (ref 65–99)
Potassium: 6 mmol/L — ABNORMAL HIGH (ref 3.5–5.1)
Sodium: 134 mmol/L — ABNORMAL LOW (ref 135–145)
Total Bilirubin: 1.9 mg/dL — ABNORMAL HIGH (ref 0.3–1.2)
Total Protein: 9.2 g/dL — ABNORMAL HIGH (ref 6.5–8.1)

## 2016-11-20 LAB — CBC WITH DIFFERENTIAL/PLATELET
Basophils Absolute: 0 10*3/uL (ref 0.0–0.1)
Basophils Relative: 0 %
EOS ABS: 0 10*3/uL (ref 0.0–0.7)
EOS PCT: 0 %
HCT: 49.1 % (ref 39.0–52.0)
Hemoglobin: 16.2 g/dL (ref 13.0–17.0)
LYMPHS PCT: 7 %
Lymphs Abs: 0.8 10*3/uL (ref 0.7–4.0)
MCH: 28 pg (ref 26.0–34.0)
MCHC: 33 g/dL (ref 30.0–36.0)
MCV: 84.8 fL (ref 78.0–100.0)
MONOS PCT: 11 %
Monocytes Absolute: 1.2 10*3/uL — ABNORMAL HIGH (ref 0.1–1.0)
Neutro Abs: 8.9 10*3/uL — ABNORMAL HIGH (ref 1.7–7.7)
Neutrophils Relative %: 82 %
PLATELETS: 357 10*3/uL (ref 150–400)
RBC: 5.79 MIL/uL (ref 4.22–5.81)
RDW: 13.1 % (ref 11.5–15.5)
WBC: 11 10*3/uL — AB (ref 4.0–10.5)

## 2016-11-20 LAB — URINALYSIS, ROUTINE W REFLEX MICROSCOPIC
Bilirubin Urine: NEGATIVE
GLUCOSE, UA: NEGATIVE mg/dL
Hgb urine dipstick: NEGATIVE
Ketones, ur: 20 mg/dL — AB
Leukocytes, UA: NEGATIVE
NITRITE: NEGATIVE
PH: 6 (ref 5.0–8.0)
Protein, ur: 100 mg/dL — AB
SPECIFIC GRAVITY, URINE: 1.025 (ref 1.005–1.030)

## 2016-11-20 LAB — LIPASE, BLOOD: LIPASE: 20 U/L (ref 11–51)

## 2016-11-20 MED ORDER — OXYMETAZOLINE HCL 0.05 % NA SOLN: 1.0000 | Freq: Two times a day (BID) | NASAL | 0 refills | Status: DC

## 2016-11-20 MED ORDER — SODIUM CHLORIDE 0.9 % IV BOLUS (SEPSIS)
1000.0000 mL | Freq: Once | INTRAVENOUS | Status: AC
  Administered 2016-11-20: 1000 mL via INTRAVENOUS

## 2016-11-20 MED ORDER — ONDANSETRON HCL 4 MG/2ML IJ SOLN
4.0000 mg | Freq: Once | INTRAMUSCULAR | Status: AC
  Administered 2016-11-20: 4 mg via INTRAVENOUS
  Filled 2016-11-20: qty 2

## 2016-11-20 MED ORDER — ONDANSETRON 4 MG PO TBDP: 4.0000 mg | ORAL_TABLET | Freq: Three times a day (TID) | ORAL | 0 refills | Status: DC | PRN

## 2016-11-20 MED ORDER — IBUPROFEN 400 MG PO TABS
600.0000 mg | ORAL_TABLET | Freq: Once | ORAL | Status: AC
  Administered 2016-11-20: 600 mg via ORAL
  Filled 2016-11-20: qty 1

## 2016-11-20 MED ORDER — ACETAMINOPHEN 325 MG PO TABS
650.0000 mg | ORAL_TABLET | Freq: Once | ORAL | Status: AC
  Administered 2016-11-20: 650 mg via ORAL
  Filled 2016-11-20: qty 2

## 2016-11-20 MED ORDER — BENZONATATE 100 MG PO CAPS: 200.0000 mg | ORAL_CAPSULE | Freq: Two times a day (BID) | ORAL | 0 refills | Status: DC | PRN

## 2016-11-20 NOTE — ED Triage Notes (Signed)
Onset 3 days productive cough, runny nose, sore throat, vomiting x 2 this morning,  decreased appetite.  No respiratory or swallowing difficulties.

## 2016-11-20 NOTE — ED Notes (Signed)
Patient transported to X-ray 

## 2016-11-20 NOTE — Discharge Instructions (Signed)
Take your medications as prescribed. I also recommend taking Tylenol and ibuprofen as prescribed over-the-counter, oximetry in between doses every 3-4 hours. Continue drinking fluids at home to remain hydrated. I recommend eating a bland diet for the next few days and taper symptoms have improved. Follow-up with your primary care provider in the next 3-4 days if symptoms have not improved. Return to the emergency department if symptoms worsen or new onset of headache, neck stiffness, difficulty breathing, coughing up blood, chest pain, abdominal pain, vomiting, unable to keep fluids down.   You blood work today also showed an elevation of your Creatinine 1.68. I recommend continuing to drink fluids to remain hydrated at home and to follow up with your primary care provider in 3-4 days to have your blood work rechecked.

## 2016-11-20 NOTE — ED Provider Notes (Signed)
[facility]-EMERGENCY DEPT Provider Note   CSN: [id number] Arrival date & time: 11/20/16  1105     History   Chief Complaint Chief Complaint  Patient presents with  . URI    HPI Robert Merritt is a 41 y.o. male.  HPI   Pt is a 41 yo male with PMH of mental retardation, GERD, PUD and seizures who presents to the ED accompanied by his mother with multiple complaints. Mother reports over the past 3 days the pt has had worsening chills, mild headache, decreased appetites, nasal congestion, rhinorrhea, productive cough, sore throat. She also notes he began having episodes NBNB vomiting last night. Mother states it appears he is vomiting up mucous. She reports given him tylenol yesterday and at 3am. Reports decreased food and fluid intake. Denies any known sick contacts. Denies fever, ear pain, hemoptysis, SOB, wheezing, CP, hematemesis, diarrhea, constipation, urinary sxs, rash. Mother reports pt had hernia repair as a child, otherwise denies any hx of abdominal surgeries.   LEVEL V CAVEAT- mental retardation  Past Medical History:  Diagnosis Date  . Cellulitis    right leg  . Family history of anesthesia complication    grandmother had hard time waking  . Fatigue 06/2010   TSH low normal, Hb wnl, CMP wnl  . GERD (gastroesophageal reflux disease)   . Groin pain    Chronic. S/p R LN aspiration culture which was negative in 2009.  Marland Kitchen Hemorrhoid   . Inguinal lymphadenopathy   . Mental retardation, mild (I.Q. 50-70)   . Myalgia    intermittent lower extremity  . PUD (peptic ulcer disease)    with h/o H.pylori infection  . Seizures (HCC)    not on medication. h/o grand mal seizures, last episode in 1995    Patient Active Problem List   Diagnosis Date Noted  . Right facial swelling 10/06/2016  . Finger injury 07/31/2015  . Superficial fungus infection of skin 07/31/2015  . Elevated blood pressure 04/16/2014  . Hyperkalemia 04/11/2014  . Health maintenance examination 12/18/2012  .  SEIZURE DISORDER 07/03/2010  . FATIGUE, ACUTE 07/03/2010  . Right groin pain 07/03/2010  . Mild intellectual disabilities 10/25/2006  . HEMORRHOIDS 10/25/2006  . GERD 10/25/2006  . PEPTIC ULCER DISEASE 10/25/2006  . HELICOBACTER PYLORI INFECTION, HX OF 10/25/2006    Past Surgical History:  Procedure Laterality Date  . HERNIA REPAIR         Home Medications    Prior to Admission medications   Medication Sig Start Date End Date Taking? Authorizing Provider  bacitracin ointment Apply 1 application topically 2 (two) times daily. Patient not taking: Reported on 11/20/2016 07/31/15   Ruben Im, MD  benzonatate (TESSALON) 100 MG capsule Take 2 capsules (200 mg total) by mouth 2 (two) times daily as needed for cough. 11/20/16   Barrett Henle, PA-C  ondansetron (ZOFRAN ODT) 4 MG disintegrating tablet Take 1 tablet (4 mg total) by mouth every 8 (eight) hours as needed for nausea or vomiting. 11/20/16   Barrett Henle, PA-C  oxymetazoline (AFRIN NASAL SPRAY) 0.05 % nasal spray Place 1 spray into both nostrils 2 (two) times daily. Spray once into each nostril twice daily for up to the next 3 days. Do not use for more than 3 days to prevent rebound rhinorrhea. 11/20/16   Barrett Henle, PA-C  terbinafine (LAMISIL) 1 % cream Apply 1 application topically 2 (two) times daily. Patient not taking: Reported on 11/20/2016 07/31/15   Ruben Im, MD  Family History Family History  Problem Relation Age of Onset  . Sleep apnea Mother   . Diabetes Maternal Grandmother   . Kidney failure Maternal Grandmother   . Sickle cell anemia Brother     Half brother died of complications of sickle cell anemia at age 43    Social History Social History  Substance Use Topics  . Smoking status: Never Smoker  . Smokeless tobacco: Never Used  . Alcohol use No     Allergies   Patient has no known allergies.   Review of Systems Review of Systems  Constitutional: Positive for  appetite change (decreased) and chills.  HENT: Positive for congestion, rhinorrhea and sore throat.   Respiratory: Positive for cough.   Gastrointestinal: Positive for abdominal pain, nausea and vomiting.  Neurological: Positive for weakness.  All other systems reviewed and are negative.    Physical Exam Updated Vital Signs BP 150/97   Pulse 78   Temp 97.5 F (36.4 C) (Oral)   Resp 18   SpO2 99%   Physical Exam  Constitutional: He is oriented to person, place, and time. He appears well-developed and well-nourished. No distress.  HENT:  Head: Normocephalic and atraumatic.  Nose: Nose normal. Right sinus exhibits no maxillary sinus tenderness and no frontal sinus tenderness. Left sinus exhibits no maxillary sinus tenderness and no frontal sinus tenderness.  Mouth/Throat: Uvula is midline, oropharynx is clear and moist and mucous membranes are normal. No oropharyngeal exudate, posterior oropharyngeal edema, posterior oropharyngeal erythema or tonsillar abscesses. No tonsillar exudate.  Eyes: Conjunctivae and EOM are normal. Pupils are equal, round, and reactive to light. Right eye exhibits no discharge. Left eye exhibits no discharge. No scleral icterus.  Neck: Normal range of motion. Neck supple.  Cardiovascular: Normal rate, regular rhythm, normal heart sounds and intact distal pulses.   Pulmonary/Chest: Effort normal and breath sounds normal. No respiratory distress. He has no wheezes. He has no rales. He exhibits no tenderness.  Abdominal: Soft. Bowel sounds are normal. He exhibits no distension and no mass. There is tenderness. There is no rebound and no guarding. No hernia.  Mild diffuse abdominal tenderness  Musculoskeletal: Normal range of motion. He exhibits no edema.  Lymphadenopathy:    He has no cervical adenopathy.  Neurological: He is alert and oriented to person, place, and time.  Skin: Skin is warm and dry. No rash noted. He is not diaphoretic.  Nursing note and vitals  reviewed.    ED Treatments / Results  Labs (all labs ordered are listed, but only abnormal results are displayed) Labs Reviewed  CBC WITH DIFFERENTIAL/PLATELET - Abnormal; Notable for the following:       Result Value   WBC 11.0 (*)    Neutro Abs 8.9 (*)    Monocytes Absolute 1.2 (*)    All other components within normal limits  COMPREHENSIVE METABOLIC PANEL - Abnormal; Notable for the following:    Sodium 134 (*)    Potassium 6.0 (*)    Chloride 92 (*)    Glucose, Bld 104 (*)    BUN 31 (*)    Creatinine, Ser 1.63 (*)    Total Protein 9.2 (*)    Albumin 5.3 (*)    Total Bilirubin 1.9 (*)    GFR calc non Af Amer 50 (*)    GFR calc Af Amer 59 (*)    Anion gap 16 (*)    All other components within normal limits  URINALYSIS, ROUTINE W REFLEX MICROSCOPIC - Abnormal; Notable  for the following:    Ketones, ur 20 (*)    Protein, ur 100 (*)    Bacteria, UA RARE (*)    Squamous Epithelial / LPF 0-5 (*)    All other components within normal limits  LIPASE, BLOOD    EKG  EKG Interpretation None       Radiology Dg Chest 2 View  Result Date: 11/20/2016 CLINICAL DATA:  Cough EXAM: CHEST  2 VIEW COMPARISON:  None. FINDINGS: Normal heart size. Normal mediastinal contour. No pneumothorax. No pleural effusion. Lungs appear clear, with no acute consolidative airspace disease and no pulmonary edema. IMPRESSION: No active cardiopulmonary disease. Electronically Signed   By: Delbert Phenix M.D.   On: 11/20/2016 12:38    Procedures Procedures (including critical care time)  Medications Ordered in ED Medications  sodium chloride 0.9 % bolus 1,000 mL (0 mLs Intravenous Stopped 11/20/16 1439)  ondansetron (ZOFRAN) injection 4 mg (4 mg Intravenous Given 11/20/16 1217)  sodium chloride 0.9 % bolus 1,000 mL (0 mLs Intravenous Stopped 11/20/16 1647)  acetaminophen (TYLENOL) tablet 650 mg (650 mg Oral Given 11/20/16 1611)  ibuprofen (ADVIL,MOTRIN) tablet 600 mg (600 mg Oral Given 11/20/16 1651)      Initial Impression / Assessment and Plan / ED Course  I have reviewed the triage vital signs and the nursing notes.  Pertinent labs & imaging results that were available during my care of the patient were reviewed by me and considered in my medical decision making (see chart for details).     Patient presents with flulike symptoms with associated cough, nausea and vomiting for the past 3 days. Patient lives at home with mother who reports that he has had decreased oral intake over the past few days. VSS. Exam revealed mild diffuse abdominal tenderness, no peritoneal signs. Lungs clear to auscultation bilaterally. Moist mucous membranes. Remaining exam unremarkable. Patient given IV fluids, zofran and tylenol. UA showed mild ketones, suspect from dehydration. Mild leukocytosis, WBC 11. Potassium 6, but noted to be hemolyzed. Cr 1.63 (baseline line Cr 0.8). CXR negative. Pt given 2L IVF and PO fluids for AKI suspected to be from dehydration 2/2 influenza/viral illness. On reevaluation, pt appears to be feeling better and is tolerating PO. Discussed results and plan for discharge with patient and mother. Advised to have patient follow up with his PCP in 3-4 days to have his blood rechecked regarding his creatinine. Plan to discharge patient home with symptomatic treatment for influenza-like illness. Discussed return precautions.  Final Clinical Impressions(s) / ED Diagnoses   Final diagnoses:  Influenza-like illness  Elevated serum creatinine    New Prescriptions Discharge Medication List as of 11/20/2016  4:52 PM    START taking these medications   Details  benzonatate (TESSALON) 100 MG capsule Take 2 capsules (200 mg total) by mouth 2 (two) times daily as needed for cough., Starting Sat 11/20/2016, Print    ondansetron (ZOFRAN ODT) 4 MG disintegrating tablet Take 1 tablet (4 mg total) by mouth every 8 (eight) hours as needed for nausea or vomiting., Starting Sat 11/20/2016, Print     oxymetazoline (AFRIN NASAL SPRAY) 0.05 % nasal spray Place 1 spray into both nostrils 2 (two) times daily. Spray once into each nostril twice daily for up to the next 3 days. Do not use for more than 3 days to prevent rebound rhinorrhea., Starting Sat 11/20/2016, Print         Satira Sark [place], [place] 11/20/16 1716    Cathren Laine, MD 11/21/16 [id number]

## 2017-03-25 ENCOUNTER — Encounter: Payer: Self-pay | Admitting: *Deleted

## 2017-04-14 ENCOUNTER — Ambulatory Visit (INDEPENDENT_AMBULATORY_CARE_PROVIDER_SITE_OTHER): Payer: Medicare Other | Admitting: Internal Medicine

## 2017-04-14 VITALS — BP 175/100 | HR 64 | Temp 97.8°F | Wt 135.1 lb

## 2017-04-14 DIAGNOSIS — B369 Superficial mycosis, unspecified: Secondary | ICD-10-CM | POA: Diagnosis not present

## 2017-04-14 DIAGNOSIS — R1031 Right lower quadrant pain: Secondary | ICD-10-CM

## 2017-04-14 NOTE — Patient Instructions (Signed)
It was a pleasure to see you today Mr. Joanne GavelSutton.  We will call your mother's phone to arrange for dermatology appointment to lok at your facial rash.  I do not see anything worrisome at your right groin. I expect the symptoms will improve with time but let us know if it gets a lot worse with rash or trouble walking.

## 2017-04-14 NOTE — Progress Notes (Signed)
   CC: Discolored facial rash, right groin pain  HPI:  [name] is a 41 y.o. man here complaining of worsened color change on his face and also right groin pain.   See problem based assessment and plan below for additional details  Past Medical History:  Diagnosis Date  . Cellulitis    right leg  . Family history of anesthesia complication    grandmother had hard time waking  . Fatigue 06/2010   TSH low normal, Hb wnl, CMP wnl  . GERD (gastroesophageal reflux disease)   . Groin pain    Chronic. S/p R LN aspiration culture which was negative in 2009.  Marland Kitchen. Hemorrhoid   . Inguinal lymphadenopathy   . Mental retardation, mild (I.Q. 50-70)   . Myalgia    intermittent lower extremity  . PUD (peptic ulcer disease)    with h/o H.pylori infection  . Seizures (HCC)    not on medication. h/o grand mal seizures, last episode in 1995    Review of Systems:  Review of Systems  Constitutional: Negative for chills and fever.  HENT: Negative for sore throat.   Cardiovascular: Negative for leg swelling.  Musculoskeletal: Negative for back pain and joint pain.  Skin: Positive for rash. Negative for itching.    Physical Exam: Physical Exam  Constitutional: He is well-developed, well-nourished, and in no distress.  HENT:  Mouth/Throat: Oropharynx is clear and moist. No oropharyngeal exudate.  Irregular bordered, hypopigmented rash on the lower half of cheeks bilaterally, nontender and not erythematous  Cardiovascular: Normal rate and regular rhythm.   Pulmonary/Chest: Effort normal and breath sounds normal.  Genitourinary: Penis normal. No discharge found.  Genitourinary Comments: No palpable inguinal lymphadenopathy  Musculoskeletal: Normal range of motion. He exhibits no edema.  No tenderness on internal or external rotation of the right hip    Vitals:   04/14/17 0930 04/14/17 0932  BP: (!) 161/100 (!) 175/100  Pulse: 64   Temp: 97.8 F (36.6 C)   TempSrc: Oral     SpO2: 100%   Weight: 135 lb 1.6 oz (61.3 kg)     Assessment & Plan:   See Encounters Tab for problem based charting.  Patient seen with Dr. Oswaldo DoneVincent

## 2017-04-15 NOTE — Assessment & Plan Note (Signed)
HPI: He has recurrence of bilateral lower facial rash with hyperpigmentation. It is not painful or pruritic but he wants it treated due to the appearance.This same problem has been ongoing intermittently since at least 2016. He has been tried on terbinafine for suspected superficial fungal infection without resolution of the problem. Referral to dermatology in August of last year was documented in the plan, but he has not seen anyone so far.  A: Superficial hypopigmented rash of the face bilaterally. There is no complete loss of skin pigment as would be seen with vitiligo. It is hard to say from his history if this ever fully resolved with relapse or has been continuously ongoing. The appearance and distribution could be pityriasis. This is not generally a scarring condition. Based on the chronicity of the problem and the patient's request we can refer to outpatient dermatology and there is no urgent need for starting treatment right now.  P: Referral to dermatology clinic

## 2017-04-15 NOTE — Assessment & Plan Note (Signed)
HPI: He continues to have episodic right groin or erythema pain that worsens sometimes while standing or walking around. This is a very chronic but intermittent problem dating back as far as ultrasound evaluation in 2009 showing mild inguinal lymph node enlargement. He has had a previous skin infection of the groin but without known complications. Plain radiographs have not demonstrated any pelvis or hip abnormality in 2016. The pain is not severe enough to prevent him from working or limiting daily activities. Physical examination today does not reveal any pain produced on internal or external rotation to suggest glenohumeral joint arthritis. There is no evidence of direct or indirect hernia.  A: Intermittent right groin pain Etiology of the of this is unclear, I would suspect there is some musculoskeletal process considering that the pain worsens with walking and standing  P: Continue observation and recommended RTC if symptoms become much worse NSAIDs PRN

## 2017-04-15 NOTE — Progress Notes (Signed)
Internal Medicine Clinic Attending  I saw and evaluated the patient.  I personally confirmed the key portions of the history and exam documented by Dr. Rice and I reviewed pertinent patient test results.  The assessment, diagnosis, and plan were formulated together and I agree with the documentation in the resident's note.  

## 2017-04-29 ENCOUNTER — Telehealth: Payer: Self-pay

## 2017-04-29 DIAGNOSIS — B360 Pityriasis versicolor: Secondary | ICD-10-CM | POA: Diagnosis not present

## 2017-04-29 MED FILL — KETOCONAZOLE 2% CREAM: 2 | 20 days supply | Qty: 60 | Fill #0 | Status: TO

## 2017-04-29 NOTE — Telephone Encounter (Signed)
Requesting to speak with a nurse about meds. Please call back.  

## 2017-04-29 NOTE — Telephone Encounter (Signed)
Pt's mother called and states dermatologist gave him a script for nizoral, wants to know how they can get it, called [facility] it will be $3.00 with medicaid, she will try to get there to pick up

## 2017-07-16 DIAGNOSIS — Z23 Encounter for immunization: Secondary | ICD-10-CM | POA: Diagnosis not present

## 2018-04-13 ENCOUNTER — Ambulatory Visit (INDEPENDENT_AMBULATORY_CARE_PROVIDER_SITE_OTHER): Payer: Medicare Other | Admitting: Internal Medicine

## 2018-04-13 ENCOUNTER — Encounter: Payer: Self-pay | Admitting: Internal Medicine

## 2018-04-13 ENCOUNTER — Other Ambulatory Visit: Payer: Self-pay

## 2018-04-13 VITALS — Temp 98.7°F | Ht 66.0 in | Wt 134.9 lb

## 2018-04-13 DIAGNOSIS — Z114 Encounter for screening for human immunodeficiency virus [HIV]: Secondary | ICD-10-CM

## 2018-04-13 DIAGNOSIS — E875 Hyperkalemia: Secondary | ICD-10-CM | POA: Diagnosis not present

## 2018-04-13 DIAGNOSIS — J029 Acute pharyngitis, unspecified: Secondary | ICD-10-CM | POA: Diagnosis not present

## 2018-04-13 DIAGNOSIS — R05 Cough: Secondary | ICD-10-CM

## 2018-04-13 DIAGNOSIS — R634 Abnormal weight loss: Secondary | ICD-10-CM

## 2018-04-13 DIAGNOSIS — I10 Essential (primary) hypertension: Secondary | ICD-10-CM

## 2018-04-13 DIAGNOSIS — F70 Mild intellectual disabilities: Secondary | ICD-10-CM

## 2018-04-13 DIAGNOSIS — R7989 Other specified abnormal findings of blood chemistry: Secondary | ICD-10-CM | POA: Diagnosis not present

## 2018-04-13 DIAGNOSIS — R51 Headache: Secondary | ICD-10-CM | POA: Diagnosis not present

## 2018-04-13 DIAGNOSIS — R42 Dizziness and giddiness: Secondary | ICD-10-CM

## 2018-04-13 DIAGNOSIS — Z8709 Personal history of other diseases of the respiratory system: Secondary | ICD-10-CM | POA: Diagnosis not present

## 2018-04-13 DIAGNOSIS — Z6821 Body mass index (BMI) 21.0-21.9, adult: Secondary | ICD-10-CM | POA: Diagnosis not present

## 2018-04-13 MED ORDER — AMLODIPINE BESYLATE 5 MG PO TABS: 5.0000 mg | ORAL_TABLET | Freq: Every day | ORAL | 2 refills | Status: DC

## 2018-04-13 NOTE — Assessment & Plan Note (Signed)
Seen in the ED 11/20/2016.  Labs at that time showed a potassium of 6.0 that there may have been some hemolysis affecting that number.  We will recheck today to ensure that he does not have true hyperkalemia.

## 2018-04-13 NOTE — Assessment & Plan Note (Signed)
Patient with almost 10 pound weight loss over the past 2 years.  Denies any sexual activity or any history of sexual activity though he is unaware of what that actually entails.  We will check an HIV today for screening purposes.

## 2018-04-13 NOTE — Patient Instructions (Addendum)
Mr. Robert Merritt,  I want to check some blood work today.  Your blood pressure has been elevated and I want to get you started on a blood pressure medication.  It would be called amlodipine.  I want to take it once daily.  Please follow-up with us in a month for a recheck.

## 2018-04-13 NOTE — Assessment & Plan Note (Signed)
BP Readings from Last 3 Encounters:  04/14/17 (!) 175/100  11/20/16 150/97  10/06/16 (!) 152/88    Lab Results  Component Value Date   NA 134 (L) 11/20/2016   K 6.0 (H) 11/20/2016   CREATININE 1.63 (H) 11/20/2016   See HPI for full details.  Plan: We will start him on amlodipine 5 mg daily.  We are rechecking a BMP to follow-up on his labs from 2018.  Patient will return in 4 to 6 weeks for follow-up.

## 2018-04-13 NOTE — Progress Notes (Signed)
CC: Headaches, dizziness  HPI:  Robert Merritt is a 42 y.o. male with a past medical history listed below here today with complaints of headaches.  He reports that he has a several month history of sore throat, nonproductive cough, frontal headaches and dizziness.  He reports that his throat has persistently felt sore all the time even without swallowing and he has been coughing and does not bring up with his cough.  He is unable to give me a clear time frame on this but says it is been happening for months.  He denies any itchy watery eyes, rhinorrhea, postnasal drip.  Does report some history of seasonal allergies.  He also notes that he frequently becomes dizzy and feels as if he is going to pass out.  He does not correlate this with any particular setting but does note upon questioning that he feels dizzy with standing at times and his symptoms are worse when he works outside in the heat.  He reports that when he becomes dizzy he has frontal headaches that he is unable to characterize.  Upon review of his vitals he does appear to have persistently elevated blood pressures dating back to 2015.  Several of them are markedly elevated with one 175/100 and another 192/88.  Additionally, upon review of his lab work he was seen in the ED 11/20/2016 and labs at that time showed a potassium of 6.0 as well as an AKI.  There was note of possible hemolysis but this was never followed-up on for a recheck.  He denies being sexually active and reports he has never been sexually active.  In fact he did not know what this meant today and I do explain it to him.  His weights have always been low and he weighs 135 pounds today down from 143 on last documented weight in December of 2017.  Orthostatics in clinic today negative.  Past Medical History:  Diagnosis Date  . Cellulitis    right leg  . Family history of anesthesia complication    grandmother had hard time waking  . Fatigue 06/2010   TSH low  normal, Hb wnl, CMP wnl  . GERD (gastroesophageal reflux disease)   . Groin pain    Chronic. S/p R LN aspiration culture which was negative in 2009.  Marland Kitchen. Hemorrhoid   . Inguinal lymphadenopathy   . Mental retardation, mild (I.Q. 50-70)   . Myalgia    intermittent lower extremity  . PUD (peptic ulcer disease)    with h/o H.pylori infection  . Seizures (HCC)    not on medication. h/o grand mal seizures, last episode in 1995   Review of Systems:   Negative except as noted in HPI  Physical Exam:  Vitals:   04/13/18 1319  Temp: 98.7 F (37.1 C)  TempSrc: Oral  SpO2: 100%  Weight: 134 lb 14.4 oz (61.2 kg)  Height: 5\' 6"  (1.676 m)  BP 155/94 P 68  Physical Exam GENERAL-alert, cooperative, thin and frail appearing gentleman in no acute distress  HEENT- Atraumatic, normocephalic, oral mucosa appears moist with no erythema or exudate present, no cervical adenopathy CARDIAC- RRR, no murmurs, rubs or gallops. RESP- Moving equal volumes of air, and clear to auscultation bilaterally, no wheezes or crackles. EXTREMITIES- pulse 2+, symmetric, no pedal edema. SKIN- Warm, dry, No rash or lesion. PSYCH- Normal mood and affect, appropriate thought content and speech.   Assessment & Plan:   See Encounters Tab for problem based charting.  Patient discussed  with Dr. Angelia Mould

## 2018-04-14 LAB — BMP8+ANION GAP
ANION GAP: 18 mmol/L (ref 10.0–18.0)
BUN/Creatinine Ratio: 10 (ref 9–20)
BUN: 9 mg/dL (ref 6–24)
CALCIUM: 9 mg/dL (ref 8.7–10.2)
CO2: 21 mmol/L (ref 20–29)
Chloride: 106 mmol/L (ref 96–106)
Creatinine, Ser: 0.9 mg/dL (ref 0.76–1.27)
GFR calc Af Amer: 120 mL/min/{1.73_m2} (ref >59)
GFR calc non Af Amer: 104 mL/min/{1.73_m2} (ref >59)
Glucose: 80 mg/dL (ref 65–99)
POTASSIUM: 4 mmol/L (ref 3.5–5.2)
SODIUM: 145 mmol/L — AB (ref 134–144)

## 2018-04-14 LAB — HIV ANTIBODY (ROUTINE TESTING W REFLEX): HIV Screen 4th Generation wRfx: NONREACTIVE

## 2018-04-17 NOTE — Progress Notes (Signed)
Internal Medicine Clinic Attending  Case discussed with Dr. Boswell at the time of the visit.  We reviewed the resident's history and exam and pertinent patient test results.  I agree with the assessment, diagnosis, and plan of care documented in the resident's note.  

## 2018-07-28 DIAGNOSIS — Z23 Encounter for immunization: Secondary | ICD-10-CM | POA: Diagnosis not present

## 2019-07-18 ENCOUNTER — Ambulatory Visit: Payer: Medicare Other

## 2019-07-18 DIAGNOSIS — Z23 Encounter for immunization: Secondary | ICD-10-CM | POA: Diagnosis not present

## 2019-08-09 ENCOUNTER — Other Ambulatory Visit: Payer: Self-pay | Admitting: Internal Medicine

## 2019-08-09 NOTE — Telephone Encounter (Signed)
Refilled, please have pt come in for an appointment with me as he has not been to clinic in some time

## 2019-09-17 ENCOUNTER — Encounter: Payer: Medicare Other | Admitting: Internal Medicine

## 2020-01-03 ENCOUNTER — Ambulatory Visit: Payer: Medicare Other | Attending: Family

## 2020-01-03 DIAGNOSIS — Z23 Encounter for immunization: Secondary | ICD-10-CM

## 2020-01-03 NOTE — Progress Notes (Signed)
   Covid-19 Vaccination Clinic  Name:  Robert Merritt    MRN: [id number] DOB: March 18, 1976  01/03/2020  Mr. Robert Merritt was observed post Covid-19 immunization for 15 minutes without incident. He was provided with Vaccine Information Sheet and instruction to access the V-Safe system.   Mr. Robert Merritt was instructed to call 911 with any severe reactions post vaccine: Marland Kitchen Difficulty breathing  . Swelling of face and throat  . A fast heartbeat  . A bad rash all over body  . Dizziness and weakness   Immunizations Administered    Name Date Dose VIS Date Route   Moderna COVID-19 Vaccine 01/03/2020 10:55 AM 0.5 mL 09/18/2019 Intramuscular   Manufacturer: Moderna   Lot: [id number]   NDC: 14782-956-21

## 2020-01-21 ENCOUNTER — Encounter: Payer: Self-pay | Admitting: Internal Medicine

## 2020-01-21 ENCOUNTER — Ambulatory Visit (INDEPENDENT_AMBULATORY_CARE_PROVIDER_SITE_OTHER): Payer: Medicare Other | Admitting: Internal Medicine

## 2020-01-21 VITALS — BP 133/83 | HR 64 | Temp 98.0°F | Ht 67.0 in | Wt 132.0 lb

## 2020-01-21 DIAGNOSIS — I10 Essential (primary) hypertension: Secondary | ICD-10-CM | POA: Diagnosis not present

## 2020-01-21 DIAGNOSIS — J309 Allergic rhinitis, unspecified: Secondary | ICD-10-CM | POA: Insufficient documentation

## 2020-01-21 DIAGNOSIS — J301 Allergic rhinitis due to pollen: Secondary | ICD-10-CM | POA: Insufficient documentation

## 2020-01-21 MED ORDER — LORATADINE 10 MG PO TABS: 10.0000 mg | ORAL_TABLET | Freq: Every day | ORAL | 2 refills | Status: DC

## 2020-01-21 MED ORDER — AMLODIPINE BESYLATE 5 MG PO TABS: ORAL_TABLET | ORAL | 1 refills | Status: DC

## 2020-01-21 MED ORDER — GUAIFENESIN-DM 100-10 MG/5ML PO SYRP: 5.0000 mL | ORAL_SOLUTION | ORAL | 0 refills | Status: DC | PRN

## 2020-01-21 MED ORDER — FLUTICASONE PROPIONATE 50 MCG/ACT NA SUSP: 1.0000 | Freq: Every day | NASAL | 0 refills | Status: DC

## 2020-01-21 NOTE — Assessment & Plan Note (Signed)
Sore throat,  Cough and intermittent headaches starting last week.  He was vaccinated for coronavirus last month.  He reports additional symptoms of rhinorrhea, sneezing that seemed to begin with the change in weather and increase in pollen.  He denies shortness of breath, chest pain or discomfort.  Erythematous nasal turbinates with extensive dry mucous present in bilateral nostrils.  Cough likely explained by post nasal drip.    -treat with flonase, claritin and robitussin for symptom relief

## 2020-01-21 NOTE — Progress Notes (Signed)
CC: HTN, allergic rhinitis  HPI:  [name] is a 44 y.o. male with PMH below.  Today we will address HTN, allergic rhinitis  Please see A&P for status of the patient's chronic medical conditions  Past Medical History:  Diagnosis Date  . Cellulitis    right leg  . Family history of anesthesia complication    grandmother had hard time waking  . Fatigue 06/2010   TSH low normal, Hb wnl, CMP wnl  . GERD (gastroesophageal reflux disease)   . Groin pain    Chronic. S/p R LN aspiration culture which was negative in 2009.  Marland Kitchen Hemorrhoid   . Inguinal lymphadenopathy   . Mental retardation, mild (I.Q. 50-70)   . Myalgia    intermittent lower extremity  . PUD (peptic ulcer disease)    with h/o H.pylori infection  . Seizures (HCC)    not on medication. h/o grand mal seizures, last episode in 1995   Review of Systems:  ROS: Pulmonary: pt denies increased work of breathing, shortness of breath,  Cardiac: pt denies palpitations, chest pain,  Abdominal: pt denies abdominal pain, nausea, vomiting, or diarrhea   Physical Exam:  Vitals:   01/21/20 0957  BP: (!) 129/91  Pulse: 73  Temp: 98 F (36.7 C)  TempSrc: Oral  SpO2: 100%  Weight: 132 lb (59.9 kg)  Height: 5\' 7"  (1.702 m)   Cardiac: JVD flat, normal rate and rhythm, clear s1 and s2, no murmurs, rubs or gallops, no LE edema Pulmonary: CTAB, not in distress Abdominal: non distended abdomen, soft and nontender Psych: Alert, conversant, in good spirits Nose: Erythematous nasal turbinates with extensive dry mucous present in bilateral nostrils.   Throat: no pharyngeal exudate or erythema   Social History   Socioeconomic History  . Marital status: Single    Spouse name: Not on file  . Number of children: Not on file  . Years of education: Not on file  . Highest education level: Not on file  Occupational History  . Occupation: works at  . Smoking status: Never Smoker  . Smokeless  tobacco: Never Used  Substance and Sexual Activity  . Alcohol use: No    Alcohol/week: 0.0 standard drinks  . Drug use: No  . Sexual activity: Not on file  Other Topics Concern  . Not on file  Social History Narrative   Lives at home with mother.   Mild mental retardation.   A special Olympics Olympian.   Works at [employer]. 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    Denies alcohol, tobacco, or drug use.   Has never been sexually active.   Social Determinants of Health   Financial Resource Strain:   . Difficulty of Paying Living Expenses:   Food Insecurity:   . Worried About [employer] in the Last Year:   . Programme researcher, broadcasting/film/video in the Last Year:   Transportation Needs:   . Barista (Medical):   Freight forwarder Lack of Transportation (Non-Medical):   Physical Activity:   . Days of Exercise per Week:   . Minutes of Exercise per Session:   Stress:   . Feeling of Stress :   Social Connections:   . Frequency of Communication with Friends and Family:   . Frequency of Social Gatherings with Friends and Family:   . Attends Religious Services:   . Active Member of Clubs or Organizations:   . Attends Marland Kitchen Meetings:   .  Marital Status:   Intimate Partner Violence:   . Fear of Current or Ex-Partner:   . Emotionally Abused:   Marland Kitchen Physically Abused:   . Sexually Abused:     Family History  Problem Relation Age of Onset  . Sleep apnea Mother   . Diabetes Maternal Grandmother   . Kidney failure Maternal Grandmother   . Sickle cell anemia Brother        Half brother died of complications of sickle cell anemia at age 83    Assessment & Plan:   See Encounters Tab for problem based charting.  Patient discussed with Dr. Lynnae January

## 2020-01-21 NOTE — Patient Instructions (Signed)
Robert Merritt, you are doing well.  I have refilled your amlodipine for your blood pressure which is well controlled on this medicine.  I have also added some medicine for your allergy symptoms with sore throat and cough.

## 2020-01-21 NOTE — Progress Notes (Signed)
Internal Medicine Clinic Attending  Case discussed with Dr. Winfrey  at the time of the visit.  We reviewed the resident's history and exam and pertinent patient test results.  I agree with the assessment, diagnosis, and plan of care documented in the resident's note.  

## 2020-01-21 NOTE — Assessment & Plan Note (Signed)
HTN: has been well controlled on amlodipine 5mg  daily.  Reports no side effects, no LE edema on physical exam.  Takes his medication faithfully.    -refill amlodipine 5mg 

## 2020-02-05 ENCOUNTER — Ambulatory Visit: Payer: Medicare Other | Attending: Family

## 2020-02-05 DIAGNOSIS — Z23 Encounter for immunization: Secondary | ICD-10-CM

## 2020-02-05 NOTE — Progress Notes (Signed)
   Covid-19 Vaccination Clinic  Name:  Robert Merritt    MRN: [id number] DOB: 02-Nov-1975  02/05/2020  [name] was observed post Covid-19 immunization for 15 minutes without incident. He was provided with Vaccine Information Sheet and instruction to access the V-Safe system.   [name] was instructed to call 911 with any severe reactions post vaccine: Marland Kitchen Difficulty breathing  . Swelling of face and throat  . A fast heartbeat  . A bad rash all over body  . Dizziness and weakness   Immunizations Administered    Name Date Dose VIS Date Route   Moderna COVID-19 Vaccine 02/05/2020 10:40 AM 0.5 mL 09/2019 Intramuscular   Manufacturer: Moderna   Lot: [id number]   NDC: 12240-018-09

## 2020-02-08 ENCOUNTER — Encounter: Payer: Self-pay | Admitting: *Deleted

## 2020-02-27 ENCOUNTER — Ambulatory Visit (HOSPITAL_COMMUNITY)
Admission: RE | Admit: 2020-02-27 | Discharge: 2020-02-27 | Disposition: A | Discharge status: Home / Self Care | Payer: Medicare Other | Source: Ambulatory Visit | Attending: Internal Medicine | Admitting: Internal Medicine

## 2020-02-27 ENCOUNTER — Other Ambulatory Visit: Payer: Self-pay

## 2020-02-27 ENCOUNTER — Ambulatory Visit (INDEPENDENT_AMBULATORY_CARE_PROVIDER_SITE_OTHER): Payer: Medicare Other | Admitting: Internal Medicine

## 2020-02-27 VITALS — BP 118/79 | HR 83 | Temp 98.1°F | Ht 67.0 in | Wt 138.5 lb

## 2020-02-27 DIAGNOSIS — I10 Essential (primary) hypertension: Secondary | ICD-10-CM

## 2020-02-27 DIAGNOSIS — M19071 Primary osteoarthritis, right ankle and foot: Secondary | ICD-10-CM | POA: Diagnosis not present

## 2020-02-27 DIAGNOSIS — M7989 Other specified soft tissue disorders: Secondary | ICD-10-CM | POA: Diagnosis not present

## 2020-02-27 DIAGNOSIS — B351 Tinea unguium: Secondary | ICD-10-CM

## 2020-02-27 MED ORDER — TERBINAFINE HCL 250 MG PO TABS: 250.0000 mg | ORAL_TABLET | Freq: Every day | ORAL | 0 refills | Status: AC

## 2020-02-27 MED ORDER — TERBINAFINE HCL 250 MG PO TABS: 250.0000 mg | ORAL_TABLET | Freq: Every day | ORAL | 0 refills | Status: DC

## 2020-02-27 NOTE — Progress Notes (Signed)
   CC: Foot swelling  HPI: RobertRobert Merritt is a 44 y.o. with PMH listed below presenting with complaint of foot swelling. Please see problem based assessment and plan for further details.  Past Medical History:  Diagnosis Date  . Cellulitis    right leg  . Family history of anesthesia complication    grandmother had hard time waking  . Fatigue 06/2010   TSH low normal, Hb wnl, CMP wnl  . GERD (gastroesophageal reflux disease)   . Groin pain    Chronic. S/p R LN aspiration culture which was negative in 2009.  Marland Kitchen Hemorrhoid   . Inguinal lymphadenopathy   . Mental retardation, mild (I.Q. 50-70)   . Myalgia    intermittent lower extremity  . PUD (peptic ulcer disease)    with h/o H.pylori infection  . Seizures (HCC)    not on medication. h/o grand mal seizures, last episode in 1995   Review of Systems: Review of Systems  Constitutional: Negative for chills, fever and malaise/fatigue.  Eyes: Negative for blurred vision.  Gastrointestinal: Negative for constipation, diarrhea, nausea and vomiting.  Musculoskeletal: Negative for back pain, falls and joint pain.  Neurological: Negative for dizziness, focal weakness and headaches.  All other systems reviewed and are negative.  Physical Exam: Vitals:   02/27/20 0836  BP: 118/79  Pulse: 83  Temp: 98.1 F (36.7 C)  TempSrc: Oral  SpO2: 100%  Weight: 138 lb 8 oz (62.8 kg)  Height: 5\' 7"  (1.702 m)   Physical Exam  Constitutional: He appears well-developed and well-nourished. No distress.  Cardiovascular: Normal rate, regular rhythm, normal heart sounds and intact distal pulses.  No murmur heard. Respiratory: Effort normal and breath sounds normal. He has no wheezes. He has no rales.  GI: Soft. Bowel sounds are normal. There is no abdominal tenderness.  Musculoskeletal:        General: Deformity (Unilateral R foot swelling without tenderness or pitting edema) present. Normal range of motion.  Skin: Skin is warm and dry.    Well healed surgical scar on RLE. Multiple thickened, brittle nails with subungual growths.    Assessment & Plan:   Fungal infection of toenail On exam, multiple discolored thickened nail consistent with onychomycosis on bilateral toes. Unclear chronicity.  - Start terbinafine 250mg  daily for 12 weeks - Monitor liver fx while on terbinafine  Swelling of right foot RobertMerritt is a 44 yo M w/ PMH of intellectual disability, htn presenting to [facility] with complaint of right foot swelling. He states 'I don't know' to most questions I ask regarding onset, duration, severity, quality of pain. He does mention having surgery to his right foot in the past but is unable to recall details regarding nature or date of the surgery. He denies any pain. Additional history obtained via telephone call to his mother who states that she has not noticed any swelling in the foot until last night. She mentions having memory difficulties herself and is unable to provide any further details.  A/P Present with Unilateral R foot swelling without tenderness, erythema, warmth. Non-pitting. Appear to be Charcot appearance but no hx of diabetes. Unclear etiology but concerning for possible fracture based on prior hx. Will get X-ray to evaluate.  - R foot X-ray   Patient discussed with Dr. 54   -[facility], PGY2 [facility] Internal Medicine Pager: [phone]

## 2020-02-27 NOTE — Assessment & Plan Note (Addendum)
Robert Merritt is a 44 yo M w/ PMH of intellectual disability, htn presenting to [facility] with complaint of right foot swelling. He states 'I don't know' to most questions I ask regarding onset, duration, severity, quality of pain. He does mention having surgery to his right foot in the past but is unable to recall details regarding nature or date of the surgery. He denies any pain. Additional history obtained via telephone call to his mother who states that she has not noticed any swelling in the foot until last night. She mentions having memory difficulties herself and is unable to provide any further details.  A/P Present with Unilateral R foot swelling without tenderness, erythema, warmth. Non-pitting. Appear to be Charcot appearance but no hx of diabetes. Unclear etiology but concerning for possible fracture based on prior hx. Will get X-ray to evaluate.  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 Will put in referral for podiatry

## 2020-02-27 NOTE — Assessment & Plan Note (Addendum)
On exam, multiple discolored thickened nail consistent with onychomycosis on bilateral toes. Unclear chronicity.  - Start terbinafine 250mg  daily for 12 weeks - Monitor liver fx while on terbinafine

## 2020-02-27 NOTE — Patient Instructions (Addendum)
Dear [name],  Thank you for allowing [place] to provide your care today. Today we discussed your foot swelling    I have ordered cmp, X-ray of your foot for you. I will call if any are abnormal.    Today we made the following changes to your medications:    Please start terbinafine 250mg  daily for 12 weeks  Please follow-up in 6 weeks.    Should you have any questions or concerns please call the internal medicine clinic at [phone].    Thank you for choosing [facility].   Fungal Nail Infection A fungal nail infection is a common infection of the toenails or fingernails. This condition affects toenails more often than fingernails. It often affects the great, or big, toes. More than one nail may be infected. The condition can be passed from person to person (is contagious). What are the causes? This condition is caused by a fungus. Several types of fungi can cause the infection. These fungi are common in moist and warm areas. If your hands or feet come into contact with the fungus, it may get into a crack in your fingernail or toenail and cause the infection. What increases the risk? The following factors may make you more likely to develop this condition:  Being male.  Being of older age.  Living with someone who has the fungus.  Walking barefoot in areas where the fungus thrives, such as showers or locker rooms.  Wearing shoes and socks that cause your feet to sweat.  Having a nail injury or a recent nail surgery.  Having certain medical conditions, such as: ? Athlete's foot. ? Diabetes. ? Psoriasis. ? Poor circulation. ? A weak body defense system (immune system). What are the signs or symptoms? Symptoms of this condition include:  A pale spot on the nail.  Thickening of the nail.  A nail that becomes yellow or brown.  A brittle or ragged nail edge.  A crumbling nail.  A nail that has lifted away from the nail bed. How is this diagnosed? This  condition is diagnosed with a physical exam. Your health care provider may take a scraping or clipping from your nail to test for the fungus. How is this treated? Treatment is not needed for mild infections. If you have significant nail changes, treatment may include:  Antifungal medicines taken by mouth (orally). You may need to take the medicine for several weeks or several months, and you may not see the results for a long time. These medicines can cause side effects. Ask your health care provider what problems to watch for.  Antifungal nail polish or nail cream. These may be used along with oral antifungal medicines.  Laser treatment of the nail.  Surgery to remove the nail. This may be needed for the most severe infections. It can take a long time, usually up to a year, for the infection to go away. The infection may also come back. Follow these instructions at home: Medicines  Take or apply over-the-counter and prescription medicines only as told by your health care provider.  Ask your health care provider about using over-the-counter mentholated ointment on your nails. Nail care  Trim your nails often.  Wash and dry your hands and feet every day.  Keep your feet dry: ? Wear absorbent socks, and change your socks frequently. ? Wear shoes that allow air to circulate, such as sandals or canvas tennis shoes. Throw out old shoes.  Do not use artificial nails.  If you go to a nail salon, make sure you choose one that uses clean instruments.  Use antifungal foot powder on your feet and in your shoes. General instructions  Do not share personal items, such as towels or nail clippers.  Do not walk barefoot in shower rooms or locker rooms.  Wear rubber gloves if you are working with your hands in wet areas.  Keep all follow-up visits as told by your health care provider. This is important. Contact a health care provider if: Your infection is not getting better or it is getting  worse after several months. Summary  A fungal nail infection is a common infection of the toenails or fingernails.  Treatment is not needed for mild infections. If you have significant nail changes, treatment may include taking medicine orally and applying medicine to your nails.  It can take a long time, usually up to a year, for the infection to go away. The infection may also come back.  Take or apply over-the-counter and prescription medicines only as told by your health care provider.  Follow instructions for taking care of your nails to help prevent infection from coming back or spreading. This information is not intended to replace advice given to you by your health care provider. Make sure you discuss any questions you have with your health care provider. Document Revised: 01/25/2019 Document Reviewed: 03/10/2018 Elsevier Patient Education  [place].

## 2020-02-27 NOTE — Addendum Note (Signed)
Addended by: Theotis Barrio on: 02/27/2020 03:26 PM   Modules accepted: Orders

## 2020-02-28 LAB — CMP14 + ANION GAP
ALT: 22 IU/L (ref 0–44)
AST: 22 IU/L (ref 0–40)
Albumin/Globulin Ratio: 1.4 (ref 1.2–2.2)
Albumin: 4.2 g/dL (ref 4.0–5.0)
Alkaline Phosphatase: 42 IU/L (ref 39–117)
Anion Gap: 15 mmol/L (ref 10.0–18.0)
BUN/Creatinine Ratio: 8 — ABNORMAL LOW (ref 9–20)
BUN: 7 mg/dL (ref 6–24)
Bilirubin Total: 0.4 mg/dL (ref 0.0–1.2)
CO2: 22 mmol/L (ref 20–29)
Calcium: 9.2 mg/dL (ref 8.7–10.2)
Chloride: 102 mmol/L (ref 96–106)
Creatinine, Ser: 0.83 mg/dL (ref 0.76–1.27)
GFR calc Af Amer: 123 mL/min/{1.73_m2} (ref >59)
GFR calc non Af Amer: 106 mL/min/{1.73_m2} (ref >59)
Globulin, Total: 2.9 g/dL (ref 1.5–4.5)
Glucose: 78 mg/dL (ref 65–99)
Potassium: 4.2 mmol/L (ref 3.5–5.2)
Sodium: 139 mmol/L (ref 134–144)
Total Protein: 7.1 g/dL (ref 6.0–8.5)

## 2020-02-28 NOTE — Progress Notes (Signed)
Internal Medicine Clinic Attending  Case discussed with Dr. Lee at the time of the visit.  We reviewed the resident's history and exam and pertinent patient test results.  I agree with the assessment, diagnosis, and plan of care documented in the resident's note.  Calloway Andrus, M.D., Ph.D.  

## 2020-02-29 ENCOUNTER — Telehealth: Payer: Self-pay | Admitting: Internal Medicine

## 2020-02-29 NOTE — Telephone Encounter (Signed)
Attempted to call both Robert Merritt and his mother multiple times to discuss X-ray result. Patient did not pick up. Voicemail not set up.

## 2020-03-05 ENCOUNTER — Encounter: Payer: Self-pay | Admitting: *Deleted

## 2020-04-04 ENCOUNTER — Encounter: Payer: Self-pay | Admitting: Podiatry

## 2020-04-04 ENCOUNTER — Other Ambulatory Visit: Payer: Self-pay

## 2020-04-04 ENCOUNTER — Ambulatory Visit (INDEPENDENT_AMBULATORY_CARE_PROVIDER_SITE_OTHER): Payer: Medicare Other | Admitting: Podiatry

## 2020-04-04 DIAGNOSIS — M79674 Pain in right toe(s): Secondary | ICD-10-CM

## 2020-04-04 DIAGNOSIS — B351 Tinea unguium: Secondary | ICD-10-CM | POA: Diagnosis not present

## 2020-04-04 DIAGNOSIS — L600 Ingrowing nail: Secondary | ICD-10-CM | POA: Diagnosis not present

## 2020-04-04 DIAGNOSIS — M79675 Pain in left toe(s): Secondary | ICD-10-CM | POA: Diagnosis not present

## 2020-04-04 NOTE — Progress Notes (Signed)
Subjective:   Patient ID: Robert Merritt, male   DOB: 44 y.o.   MRN: [id number]   HPI Patient presents with caregiver stating he is having problems with the fifth nails of both feet with them becoming thick dystrophic and they can become painful with certain shoe gear or touch socks.  Patient states his family doctor gave him oral antifungal but would prefer not to take and patient does not smoke likes to be active   Review of Systems  All other systems reviewed and are negative.       Objective:  Physical Exam Vitals and nursing note reviewed.  Constitutional:      Appearance: He is well-developed.  Pulmonary:     Effort: Pulmonary effort is normal.  Musculoskeletal:        General: Normal range of motion.  Skin:    General: Skin is warm.  Neurological:     Mental Status: He is alert.     Neurovascular status found to be intact muscle strength found to be adequate range of motion within normal limits.  Patient right over left fifth nail is thickened and dystrophic and moderately painful only upon significant pressure.  There is no current looseness of the nailbed noted.  Patient has good digital perfusion well oriented x3      Assessment:  Chronic nail disease that most likely is trauma with mild discomfort fungal component fifth nail bilateral F2 H&P reviewed condition and at this point I went ahead and I did repeat debridement of the nailbeds lowering them I discussed permanent procedure which may be necessary but will get a hold off currently.  Patient will be seen back to recheck     Plan:  Above mentions the H&P what we did also educated him on stopping the antifungal along with caregiver

## 2020-04-09 ENCOUNTER — Ambulatory Visit: Payer: Medicare Other

## 2020-06-06 NOTE — Progress Notes (Signed)
   CC: Hypertension and cough  HPI:  Robert Merritt is a 44 y.o. at the history listed below including hypertension, GERD, intellectual disability, and seizures(not on any medications) presenting for follow-up of his hypertension and a chronic cough.  Past Medical History:  Diagnosis Date  . Cellulitis    right leg  . Family history of anesthesia complication    grandmother had hard time waking  . Fatigue 06/2010   TSH low normal, Hb wnl, CMP wnl  . GERD (gastroesophageal reflux disease)   . Groin pain    Chronic. S/p R LN aspiration culture which was negative in 2009.  Marland Kitchen Hemorrhoid   . Inguinal lymphadenopathy   . Mental retardation, mild (I.Q. 50-70)   . Myalgia    intermittent lower extremity  . PUD (peptic ulcer disease)    with h/o H.pylori infection  . Seizures (HCC)    not on medication. h/o grand mal seizures, last episode in 1995   Review of Systems:   Constitutional: Negative for chills and fever.  Respiratory: Negative for shortness of breath.    Positive for cough. Cardiovascular: Negative for chest pain and leg swelling.  Gastrointestinal: Negative for abdominal pain, nausea and vomiting.  Neurological: Negative for dizziness and headaches.   Physical Exam:  Vitals:   06/09/20 0945 06/09/20 1049  BP: (!) 148/86 (!) 141/88  Pulse: 86 77  Temp: 98 F (36.7 C)   TempSrc: Oral   SpO2: 100% 100%   Physical Exam Constitutional:      Appearance: Normal appearance.  HENT:     Head: Normocephalic and atraumatic.  Cardiovascular:     Rate and Rhythm: Normal rate and regular rhythm.     Pulses: Normal pulses.     Heart sounds: Normal heart sounds.  Pulmonary:     Effort: Pulmonary effort is normal.     Breath sounds: Normal breath sounds.  Abdominal:     General: Abdomen is flat. Bowel sounds are normal.     Palpations: Abdomen is soft.  Musculoskeletal:        General: No swelling. Normal range of motion.     Cervical back: Normal range of motion  and neck supple.  Skin:    General: Skin is warm and dry.     Capillary Refill: Capillary refill takes less than 2 seconds.  Neurological:     General: No focal deficit present.     Mental Status: He is alert and oriented to person, place, and time.  Psychiatric:        Mood and Affect: Mood normal.        Behavior: Behavior normal.      Assessment & Plan:   See Encounters Tab for problem based charting.  Patient discussed with Dr. Sandre Kitty

## 2020-06-09 ENCOUNTER — Encounter: Payer: Self-pay | Admitting: Internal Medicine

## 2020-06-09 ENCOUNTER — Ambulatory Visit (INDEPENDENT_AMBULATORY_CARE_PROVIDER_SITE_OTHER): Payer: Medicare Other | Admitting: Internal Medicine

## 2020-06-09 ENCOUNTER — Other Ambulatory Visit: Payer: Self-pay

## 2020-06-09 VITALS — BP 141/88 | HR 77 | Temp 98.0°F

## 2020-06-09 DIAGNOSIS — R059 Cough, unspecified: Secondary | ICD-10-CM | POA: Insufficient documentation

## 2020-06-09 DIAGNOSIS — R05 Cough: Secondary | ICD-10-CM | POA: Diagnosis not present

## 2020-06-09 DIAGNOSIS — I10 Essential (primary) hypertension: Secondary | ICD-10-CM

## 2020-06-09 MED ORDER — AMLODIPINE BESYLATE 10 MG PO TABS: ORAL_TABLET | ORAL | 1 refills | Status: DC

## 2020-06-09 MED ORDER — PANTOPRAZOLE SODIUM 20 MG PO TBEC: 20.0000 mg | DELAYED_RELEASE_TABLET | Freq: Every day | ORAL | 5 refills | Status: DC

## 2020-06-09 NOTE — Assessment & Plan Note (Signed)
Patient is currently on amlodipine 5 mg daily, he denies any issues taking this medication.  Denies any side effects to the medication.  Denies any chest pain, shortness of breath, headaches, lightheadedness, dizziness, fatigue, weakness, or lower extremity swelling.  Blood pressure today is elevated at 148/86, repeat was 141/88.  Blood pressure appears uncontrolled at this time.  We will increase his amlodipine and monitor.  -Increase amlodipine to 10 mg daily -RTC in 1 month for blood pressure recheck

## 2020-06-09 NOTE — Assessment & Plan Note (Signed)
Patient reports that he continues to have a persistent cough, reports that it started again on Saturday, he is not sure how long it has been going on but thinks it may be a few months.  He states that the cough is worse at night, does not know if it changes with meals.  He denies any chest pain, shortness of breath, fevers, chills, nausea, vomiting, headaches, lightheadedness, dizziness, or fatigue.  He is currently taking Flonase daily and Claritin daily.  Reports he is not sure if this helped.  On exam his cardiac and pulmonary exam are unremarkable, no cobblestoning noted in posterior pharynx, otherwise unremarkable.  Patient does have a history of GERD.  He is currently not on any medications that could be contributing to his cough.  At this time we will start him on daily PPI to see if this helps with his chronic cough.  Differential for most common causes of chronic cough includes postnasal drip, bronchitis, GERD, and cough variant asthma.  If symptoms persist we could consider obtaining PFTs.  -Start pantoprazole 20 mg daily -Continue Flonase and claritin -RTC in 1 month to assess cough

## 2020-06-09 NOTE — Patient Instructions (Signed)
Mr. Robert Merritt,  It was a pleasure to see you today. Thank you for coming in.   Today we discussed your cough.  In regards to this please continue taking the Flonase and Claritin.   Please start taking pantoprazole daily, your cough could be from acid reflux.   We also discussed your blood pressure. This was a little high today.  Please increase your amlodipine to 10 mg daily.   Please return to clinic in 1 month or sooner if needed.   Thank you again for coming in.   Claudean Severance.D.

## 2020-06-10 NOTE — Progress Notes (Signed)
Internal Medicine Clinic Attending  Case discussed with Dr. Krienke at the time of the visit.  We reviewed the resident's history and exam and pertinent patient test results.  I agree with the assessment, diagnosis, and plan of care documented in the resident's note.  Aleza Pew, M.D., Ph.D.  

## 2020-06-30 ENCOUNTER — Encounter: Payer: Self-pay | Admitting: Internal Medicine

## 2020-06-30 ENCOUNTER — Ambulatory Visit (INDEPENDENT_AMBULATORY_CARE_PROVIDER_SITE_OTHER): Payer: Medicare Other | Admitting: Internal Medicine

## 2020-06-30 ENCOUNTER — Other Ambulatory Visit: Payer: Self-pay

## 2020-06-30 VITALS — BP 141/96 | HR 75 | Temp 98.1°F | Ht 67.0 in | Wt 130.1 lb

## 2020-06-30 DIAGNOSIS — R600 Localized edema: Secondary | ICD-10-CM

## 2020-06-30 DIAGNOSIS — I10 Essential (primary) hypertension: Secondary | ICD-10-CM

## 2020-06-30 LAB — D-DIMER, QUANTITATIVE: D-Dimer, Quant: 0.44 ug/mL-FEU (ref 0.00–0.50)

## 2020-06-30 MED ORDER — AMLODIPINE BESYLATE 5 MG PO TABS: ORAL_TABLET | ORAL | Status: DC

## 2020-06-30 MED ORDER — FUROSEMIDE 20 MG PO TABS: 20.0000 mg | ORAL_TABLET | Freq: Every day | ORAL | 0 refills | Status: DC

## 2020-06-30 MED ORDER — AMLODIPINE BESYLATE 5 MG PO TABS: ORAL_TABLET | ORAL | 1 refills | Status: DC

## 2020-06-30 NOTE — Assessment & Plan Note (Addendum)
Mr.Valko is a 44 yo M w/ PMH of HTN, GERD, Intellectual disability presenting with lower extremity swelling. He mentions he was in his usual state of health until about a week ago when his co-workers pointed out to him that his legs appear swollen. Since then he had noticed some discomfort associated with walking due to the swelling. He has not tried any medications to relieve his symptoms. He denies any chest pain, palpitations, dyspnea, orthopnea, cough or light-headedness. He mentions being adherent to his blood pressure regimen at home.  A/P Noted to have bilateral asymmetrical pitting edema of lower extremities. Chart review shows increase in amlodipine dose to 10mg  around onset of symptoms. Likely having lower extremity swelling due to adverse effect from amlodipine. Other differential includes diastolic heart failure, OSA, proteinuria, DVT. Wells score of 0 for DVT but asymmetrical appearance concerning. Will get D-dimer to r/o.  - D-dimer - Reduce amlodipine dose - Start furosemide 20mg  daily - F/u in 1 weeks - Can consider further work-up with screening for proteinuria / echocardiogram if persistent edema despite reducing amlodipine dose - Discussed case w/ , Mr.Deltoro's sister, per his request

## 2020-06-30 NOTE — Patient Instructions (Signed)
Thank you for allowing [place] to provide your care today. Today we discussed your leg swelling    I have ordered bmp, d-dimer labs for you. I will call if any are abnormal.    Today we made the following changes to your medications.    Please reduce your amlodipine to 5mg  daily Please start furosemide 20mg  daily for 7 days  Please follow-up in 7 days.    Should you have any questions or concerns please call the internal medicine clinic at [phone].     Edema  Edema is when you have too much fluid in your body or under your skin. Edema may make your legs, feet, and ankles swell up. Swelling is also common in looser tissues, like around your eyes. This is a common condition. It gets more common as you get older. There are many possible causes of edema. Eating too much salt (sodium) and being on your feet or sitting for a long time can cause edema in your legs, feet, and ankles. Hot weather may make edema worse. Edema is usually painless. Your skin may look swollen or shiny. Follow these instructions at home:  Keep the swollen body part raised (elevated) above the level of your heart when you are sitting or lying down.  Do not sit still or stand for a long time.  Do not wear tight clothes. Do not wear garters on your upper legs.  Exercise your legs. This can help the swelling go down.  Wear elastic bandages or support stockings as told by your doctor.  Eat a low-salt (low-sodium) diet to reduce fluid as told by your doctor.  Depending on the cause of your swelling, you may need to limit how much fluid you drink (fluid restriction).  Take over-the-counter and prescription medicines only as told by your doctor. Contact a doctor if:  Treatment is not working.  You have heart, liver, or kidney disease and have symptoms of edema.  You have sudden and unexplained weight gain. Get help right away if:  You have shortness of breath or chest pain.  You cannot breathe when you lie  down.  You have pain, redness, or warmth in the swollen areas.  You have heart, liver, or kidney disease and get edema all of a sudden.  You have a fever and your symptoms get worse all of a sudden. Summary  Edema is when you have too much fluid in your body or under your skin.  Edema may make your legs, feet, and ankles swell up. Swelling is also common in looser tissues, like around your eyes.  Raise (elevate) the swollen body part above the level of your heart when you are sitting or lying down.  Follow your doctor's instructions about diet and how much fluid you can drink (fluid restriction). This information is not intended to replace advice given to you by your health care provider. Make sure you discuss any questions you have with your health care provider. Document Revised: 10/07/2017 Document Reviewed: 10/22/2016 Elsevier Patient Education  2020 10/09/2017.

## 2020-06-30 NOTE — Assessment & Plan Note (Signed)
BP Readings from Last 3 Encounters:  06/30/20 (!) 141/96  06/09/20 (!) 141/88  02/27/20 118/79   BP above goal. Had been on amlodipine 5mg  until recently when dose was titrated up to 10mg . Will need to decreased back down to 5mg  due to side effects. Would benefit from addition of a different class of anti-hypertensive but will re-assess after he is more euvolemic.  - Decrease amlodipine down to 5mg  daily - Consider starting losartan 50mg  if remains hypertensive - BMP

## 2020-06-30 NOTE — Progress Notes (Signed)
CC: leg swelling  HPI: Robert Merritt is a 44 y.o. with PMH listed below presenting with complaint of leg swelling. Please see problem based assessment and plan for further details.  Past Medical History:  Diagnosis Date  . Cellulitis    right leg  . Family history of anesthesia complication    grandmother had hard time waking  . Fatigue 06/2010   TSH low normal, Hb wnl, CMP wnl  . GERD (gastroesophageal reflux disease)   . Groin pain    Chronic. S/p R LN aspiration culture which was negative in 2009.  Marland Kitchen Hemorrhoid   . Inguinal lymphadenopathy   . Mental retardation, mild (I.Q. 50-70)   . Myalgia    intermittent lower extremity  . PUD (peptic ulcer disease)    with h/o H.pylori infection  . Seizures (HCC)    not on medication. h/o grand mal seizures, last episode in 1995   Review of Systems: Review of Systems  Constitutional: Negative for chills, fever and malaise/fatigue.  Respiratory: Negative for cough and shortness of breath.   Cardiovascular: Positive for leg swelling. Negative for chest pain and palpitations.  Gastrointestinal: Negative for constipation, diarrhea, nausea and vomiting.  Genitourinary: Negative for dysuria and urgency.  Musculoskeletal: Negative for joint pain.  Neurological: Negative for dizziness and headaches.    Physical Exam: Vitals:   06/30/20 0847  BP: (!) 141/96  Pulse: 75  Temp: 98.1 F (36.7 C)  TempSrc: Oral  SpO2: 100%  Weight: 130 lb 1.6 oz (59 kg)  Height: 5\' 7"  (1.702 m)   Gen: Well-developed, well nourished, NAD HEENT: NCAT head, hearing intact CV: RRR, S1, S2 normal Pulm: CTAB, No rales, no wheezes Extm: ROM intact, Peripheral pulses intact, 2+ pitting edema up to knee on RLE, 2+ pitting edema up to mid shin on LLE pictured below, no calf tenderness Skin: Dry, Warm, normal turgor, no wounds, no rashes, no lesions      Assessment & Plan:   Essential hypertension BP Readings from Last 3 Encounters:  06/30/20  (!) 141/96  06/09/20 (!) 141/88  02/27/20 118/79   BP above goal. Had been on amlodipine 5mg  until recently when dose was titrated up to 10mg . Will need to decreased back down to 5mg  due to side effects. Would benefit from addition of a different class of anti-hypertensive but will re-assess after he is more euvolemic.  - Decrease amlodipine down to 5mg  daily - Consider starting losartan 50mg  if remains hypertensive - BMP  Bilateral lower extremity edema Robert Merritt is a 44 yo M w/ PMH of HTN, GERD, Intellectual disability presenting with lower extremity swelling. He mentions he was in his usual state of health until about a week ago when his co-workers pointed out to him that his legs appear swollen. Since then he had noticed some discomfort associated with walking due to the swelling. He has not tried any medications to relieve his symptoms. He denies any chest pain, palpitations, dyspnea, orthopnea, cough or light-headedness. He mentions being adherent to his blood pressure regimen at home.  A/P Noted to have bilateral asymmetrical pitting edema of lower extremities. Chart review shows increase in amlodipine dose to 10mg  around onset of symptoms. Likely having lower extremity swelling due to adverse effect from amlodipine. Other differential includes diastolic heart failure, OSA, proteinuria, DVT. Wells score of 0 for DVT but asymmetrical appearance concerning. Will get D-dimer to r/o.  - D-dimer - Reduce amlodipine dose - Start furosemide 20mg  daily - F/u in 2 weeks -  Can consider further work-up with screening for proteinuria / echocardiogram if persistent edema despite reducing amlodipine dose - Discussed case w/ Luretha Rued, Mr.Chavis's sister, per his request    Patient discussed with Dr. Sandre Kitty  -Judeth Cornfield, PGY3 [facility] Internal Medicine Pager: [phone]

## 2020-07-01 LAB — BMP8+ANION GAP
Anion Gap: 15 mmol/L (ref 10.0–18.0)
BUN/Creatinine Ratio: 11 (ref 9–20)
BUN: 10 mg/dL (ref 6–24)
CO2: 22 mmol/L (ref 20–29)
Calcium: 8.9 mg/dL (ref 8.7–10.2)
Chloride: 102 mmol/L (ref 96–106)
Creatinine, Ser: 0.88 mg/dL (ref 0.76–1.27)
GFR calc Af Amer: 119 mL/min/{1.73_m2} (ref >59)
GFR calc non Af Amer: 103 mL/min/{1.73_m2} (ref >59)
Glucose: 91 mg/dL (ref 65–99)
Potassium: 4 mmol/L (ref 3.5–5.2)
Sodium: 139 mmol/L (ref 134–144)

## 2020-07-01 NOTE — Progress Notes (Signed)
Internal Medicine Clinic Attending  Case discussed with Dr. Lee at the time of the visit.  We reviewed the resident's history and exam and pertinent patient test results.  I agree with the assessment, diagnosis, and plan of care documented in the resident's note.  Robert Merritt, M.D., Ph.D.  

## 2020-07-09 ENCOUNTER — Ambulatory Visit: Payer: Medicare Other | Admitting: Podiatry

## 2020-07-14 ENCOUNTER — Ambulatory Visit (INDEPENDENT_AMBULATORY_CARE_PROVIDER_SITE_OTHER): Payer: Medicare Other | Admitting: Podiatry

## 2020-07-14 ENCOUNTER — Other Ambulatory Visit: Payer: Self-pay

## 2020-07-14 DIAGNOSIS — M79675 Pain in left toe(s): Secondary | ICD-10-CM

## 2020-07-14 DIAGNOSIS — B351 Tinea unguium: Secondary | ICD-10-CM

## 2020-07-14 DIAGNOSIS — M79674 Pain in right toe(s): Secondary | ICD-10-CM

## 2020-07-14 DIAGNOSIS — M25471 Effusion, right ankle: Secondary | ICD-10-CM

## 2020-07-14 NOTE — Progress Notes (Signed)
   SUBJECTIVE Patient presents to office today complaining of elongated, thickened nails that cause pain while ambulating in shoes.  He is unable to trim his own nails.  Patient also complains of some edema to the right lower extremity.  It is not symptomatic however he has been doing swelling for the last year or so.  Chronic in nature.  Patient is here for further evaluation and treatment.  Past Medical History:  Diagnosis Date  . Cellulitis    right leg  . Family history of anesthesia complication    grandmother had hard time waking  . Fatigue 06/2010   TSH low normal, Hb wnl, CMP wnl  . GERD (gastroesophageal reflux disease)   . Groin pain    Chronic. S/p R LN aspiration culture which was negative in 2009.  Marland Kitchen Hemorrhoid   . Inguinal lymphadenopathy   . Mental retardation, mild (I.Q. 50-70)   . Myalgia    intermittent lower extremity  . PUD (peptic ulcer disease)    with h/o H.pylori infection  . Seizures (HCC)    not on medication. h/o grand mal seizures, last episode in 1995    OBJECTIVE General Patient is awake, alert, and oriented x 3 and in no acute distress. Derm Skin is dry and supple bilateral. Negative open lesions or macerations. Remaining integument unremarkable. Nails are tender, long, thickened and dystrophic with subungual debris, consistent with onychomycosis, 1-5 bilateral. No signs of infection noted. Vasc  DP and PT pedal pulses palpable bilaterally. Temperature gradient within normal limits.  Moderate edema noted to the right lower extremity greater than the left. Neuro Epicritic and protective threshold sensation grossly intact bilaterally.  Musculoskeletal Exam No symptomatic pedal deformities noted bilateral. Muscular strength within normal limits.  ASSESSMENT 1. Onychodystrophic nails 1-5 bilateral with hyperkeratosis of nails.  2. Onychomycosis of nail due to dermatophyte bilateral 3. Pain in foot bilateral 4.  Edema right lower extremity  PLAN OF  CARE 1. Patient evaluated today.  2. Instructed to maintain good pedal hygiene and foot care.  3. Mechanical debridement of nails 1-5 bilaterally performed using a nail nipper. Filed with dremel without incident.  4.  Recommend below-knee compression socks daily  5.  Return to clinic in 3 mos.    Felecia Shelling, DPM [facility]  Dr. Felecia Shelling, DPM    [address]                                        [place], [place] [address]                Office [phone]  Fax [phone]

## 2020-08-21 ENCOUNTER — Ambulatory Visit (INDEPENDENT_AMBULATORY_CARE_PROVIDER_SITE_OTHER): Payer: Medicare Other | Admitting: *Deleted

## 2020-08-21 DIAGNOSIS — Z23 Encounter for immunization: Secondary | ICD-10-CM

## 2020-11-26 ENCOUNTER — Other Ambulatory Visit: Payer: Self-pay

## 2020-11-26 ENCOUNTER — Encounter: Payer: Self-pay | Admitting: Student

## 2020-11-26 ENCOUNTER — Ambulatory Visit (INDEPENDENT_AMBULATORY_CARE_PROVIDER_SITE_OTHER): Payer: Medicare Other | Admitting: Student

## 2020-11-26 VITALS — BP 122/84 | HR 73 | Temp 98.1°F | Wt 133.4 lb

## 2020-11-26 DIAGNOSIS — J301 Allergic rhinitis due to pollen: Secondary | ICD-10-CM

## 2020-11-26 DIAGNOSIS — I10 Essential (primary) hypertension: Secondary | ICD-10-CM

## 2020-11-26 MED ORDER — LORATADINE 10 MG PO TABS: 10.0000 mg | ORAL_TABLET | Freq: Every day | ORAL | 2 refills | Status: DC

## 2020-11-26 MED ORDER — FLUTICASONE PROPIONATE 50 MCG/ACT NA SUSP: 1.0000 | Freq: Every day | NASAL | 0 refills | Status: DC

## 2020-11-26 NOTE — Assessment & Plan Note (Signed)
BP within normal limits (122/84) today while taking amlodipine 5mg  daily. -Amlodipine 5mg  daily

## 2020-11-26 NOTE — Patient Instructions (Signed)
Mr. Rayburn,  It was a pleasure meeting you today in clinic.  For your cough and runny nose: Your symptoms are not concerning for an infection. Your symptoms are most likely due to allergies and post-nasal drip, where your runny nose drips down the back of your throat and causes a cough. I will prescribe loratadine (Claritin) 10mg  which I ask that you take daily. Also, I will prescribe Flonase which is a nasal decongestant which will help with the runny nose and likely the cough.  Sincerely, Dr. , MD

## 2020-11-26 NOTE — Assessment & Plan Note (Signed)
Patient endorses 2-3 week history of nonproductive cough and rhinitis. He has a history of allergies for which he was previously prescribed loratadine and fluticasone nasal spray. He states that he has not been taking either medication for several months. He otherwise denies fevers, chills, shortness of breath, wheezing, sore throat, sneezing, congestion, nausea, chest pain, reflux. Patient's cough likely secondary to postnasal drip. -Refill loratadine daily -Refill fluticasone nasal spray

## 2020-11-26 NOTE — Progress Notes (Signed)
   CC: Cough   HPI:  [name] is a 45 y.o. with past medical history significant for HTN, mild intellectual disability, chronic cough, GERD and seasonal allergies. Refer to problem list for charting of this encounter.  Past Medical History:  Diagnosis Date  . Cellulitis    right leg  . Family history of anesthesia complication    grandmother had hard time waking  . Fatigue 06/2010   TSH low normal, Hb wnl, CMP wnl  . GERD (gastroesophageal reflux disease)   . Groin pain    Chronic. S/p R LN aspiration culture which was negative in 2009.  Marland Kitchen Hemorrhoid   . Inguinal lymphadenopathy   . Mental retardation, mild (I.Q. 50-70)   . Myalgia    intermittent lower extremity  . PEPTIC ULCER DISEASE 10/25/2006   Qualifier: Diagnosis of  By: End MD, Cristal Deer    . PUD (peptic ulcer disease)    with h/o H.pylori infection  . Seizures (HCC)    not on medication. h/o grand mal seizures, last episode in 1995   Review of Systems:  Endorses non-productive cough. Denies chest pain, shortness of breath, nausea, vomiting, fevers, chills, sick contacts, sore throat.  Physical Exam:  Vitals:   11/26/20 0918  BP: 122/84  Pulse: 73  Temp: 98.1 F (36.7 C)  TempSrc: Oral  SpO2: 100%  Weight: 133 lb 6.4 oz (60.5 kg)   Physical Exam Vitals reviewed.  Constitutional:      General: He is not in acute distress.    Appearance: He is not ill-appearing.  HENT:     Right Ear: Tympanic membrane, ear canal and external ear normal.     Left Ear: Tympanic membrane, ear canal and external ear normal.     Nose: Rhinorrhea present.     Mouth/Throat:     Mouth: Mucous membranes are moist.     Pharynx: Oropharynx is clear. No oropharyngeal exudate or posterior oropharyngeal erythema.  Eyes:     General:        Right eye: No discharge.        Left eye: No discharge.  Pulmonary:     Effort: Pulmonary effort is normal. No respiratory distress.     Breath sounds: Normal breath sounds. No  wheezing or rales.  Neurological:     Mental Status: He is alert.      Assessment & Plan:   See Encounters Tab for problem based charting.  Patient seen with Dr. Mayford Knife

## 2020-11-28 NOTE — Progress Notes (Signed)
Internal Medicine Clinic Attending  Case discussed with Dr. Johnson  At the time of the visit.  We reviewed the resident's history and exam and pertinent patient test results.  I agree with the assessment, diagnosis, and plan of care documented in the resident's note.  

## 2020-12-30 ENCOUNTER — Telehealth: Payer: Self-pay

## 2020-12-30 NOTE — Telephone Encounter (Signed)
ERROR

## 2021-02-28 ENCOUNTER — Encounter: Payer: Self-pay | Admitting: *Deleted

## 2021-02-28 NOTE — Progress Notes (Signed)

## 2021-03-02 NOTE — Progress Notes (Signed)
Things That May Be Affecting Your Health:  Alcohol  Hearing loss  Pain    Depression  Home Safety  Sexual Health   Diabetes  Lack of physical activity x Stress   Difficulty with daily activities  Loneliness  Tiredness   Drug use x Medicines  Tobacco use   Falls  Motor Vehicle Safety  Weight  x Food choices  Oral Health  Other    YOUR PERSONALIZED HEALTH PLAN : 1. Schedule your next subsequent Medicare Wellness visit in one year 2. Attend all of your regular appointments to address your medical issues 3. Complete the preventative screenings and services   Annual Wellness Visit   Medicare Covered Preventative Screenings and Services  Services & Screenings Men and Women Who How Often Need? Date of Last Service Action  Abdominal Aortic Aneurysm Adults with AAA risk factors Once      Alcohol Misuse and Counseling All Adults Screening once a year if no alcohol misuse. Counseling up to 4 face to face sessions.     Bone Density Measurement  Adults at risk for osteoporosis Once every 2 yrs      Lipid Panel Z13.6 All adults without CV disease Once every 5 yrs       Colorectal Cancer   Stool sample or  Colonoscopy All adults 50 and older   Once every year  Every 10 years x       Depression All Adults Once a year  Today   Diabetes Screening Blood glucose, post glucose load, or GTT Z13.1  All adults at risk  Pre-diabetics  Once per year  Twice per year      Diabetes  Self-Management Training All adults Diabetics 10 hrs first year; 2 hours subsequent years. Requires Copay     Glaucoma  Diabetics  Family history of glaucoma  African Americans 50 yrs +  Hispanic Americans 65 yrs + Annually - requires coppay      Hepatitis C Z72.89 or F19.20  High Risk for HCV  Born between 1945 and 1965  Annually  Once x     HIV Z11.4 All adults based on risk  Annually btw ages 66 & 42 regardless of risk  Annually > 65 yrs if at increased risk      Lung Cancer Screening  Asymptomatic adults aged 88-77 with 30 pack yr history and current smoker OR quit within the last 15 yrs Annually Must have counseling and shared decision making documentation before first screen      Medical Nutrition Therapy Adults with   Diabetes  Renal disease  Kidney transplant within past 3 yrs 3 hours first year; 2 hours subsequent years     Obesity and Counseling All adults Screening once a year Counseling if BMI 30 or higher  Today   Tobacco Use Counseling Adults who use tobacco  Up to 8 visits in one year     Vaccines Z23  Hepatitis B  Influenza   Pneumonia  Adults   Once  Once every flu season  Two different vaccines separated by one year x    Next Annual Wellness Visit People with Medicare Every year  Today     Services & Screenings Women Who How Often Need  Date of Last Service Action  Mammogram  Z12.31 Women over 40 One baseline ages 75-39. Annually ager 40 yrs+      Pap tests All women Annually if high risk. Every 2 yrs for normal risk women  Screening for cervical cancer with   Pap (Z01.419 nl or Z01.411abnl) &  HPV Z11.51 Women aged 29 to 72 Once every 5 yrs     Screening pelvic and breast exams All women Annually if high risk. Every 2 yrs for normal risk women     Sexually Transmitted Diseases  Chlamydia  Gonorrhea  Syphilis All at risk adults Annually for non pregnant females at increased risk         Services & Screenings Men Who How Ofter Need  Date of Last Service Action  Prostate Cancer - DRE & PSA Men over 50 Annually.  DRE might require a copay.        Sexually Transmitted Diseases  Syphilis All at risk adults Annually for men at increased risk      Health Maintenance List Health Maintenance  Topic Date Due  . Hepatitis C Screening  Never done  . COLONOSCOPY (Pts 45-49yrs Insurance coverage will need to be confirmed)  Never done  . COVID-19 Vaccine (3 - Booster for Moderna series) 07/07/2020  . TETANUS/TDAP   07/17/2020  . INFLUENZA VACCINE  05/18/2021  . HIV Screening  Completed  . HPV VACCINES  Aged Out

## 2021-03-22 ENCOUNTER — Encounter: Payer: Self-pay | Admitting: *Deleted

## 2021-04-21 ENCOUNTER — Other Ambulatory Visit: Payer: Self-pay

## 2021-04-21 DIAGNOSIS — I10 Essential (primary) hypertension: Secondary | ICD-10-CM

## 2021-04-21 MED ORDER — AMLODIPINE BESYLATE 5 MG PO TABS: ORAL_TABLET | ORAL | 1 refills | Status: DC

## 2021-04-21 NOTE — Telephone Encounter (Signed)
amLODipine (NORVASC) 5 MG tablet, refill request @  [facility] [phone] - [place], [place] - [address] Phone:  [phone]  Fax:  [phone]     Per pt's sister the pharmacy is waiting to hear back from the clinic. Pt is completely out of med.

## 2021-06-01 ENCOUNTER — Ambulatory Visit (INDEPENDENT_AMBULATORY_CARE_PROVIDER_SITE_OTHER): Payer: Medicare Other | Admitting: Internal Medicine

## 2021-06-01 ENCOUNTER — Encounter: Payer: Self-pay | Admitting: Internal Medicine

## 2021-06-01 VITALS — BP 136/73 | HR 55 | Temp 97.7°F | Wt 134.5 lb

## 2021-06-01 DIAGNOSIS — Z131 Encounter for screening for diabetes mellitus: Secondary | ICD-10-CM

## 2021-06-01 DIAGNOSIS — R7303 Prediabetes: Secondary | ICD-10-CM | POA: Diagnosis not present

## 2021-06-01 DIAGNOSIS — Z13220 Encounter for screening for lipoid disorders: Secondary | ICD-10-CM | POA: Diagnosis not present

## 2021-06-01 DIAGNOSIS — J301 Allergic rhinitis due to pollen: Secondary | ICD-10-CM

## 2021-06-01 DIAGNOSIS — R55 Syncope and collapse: Secondary | ICD-10-CM

## 2021-06-01 DIAGNOSIS — R7989 Other specified abnormal findings of blood chemistry: Secondary | ICD-10-CM

## 2021-06-01 DIAGNOSIS — Z0000 Encounter for general adult medical examination without abnormal findings: Secondary | ICD-10-CM | POA: Diagnosis not present

## 2021-06-01 DIAGNOSIS — I10 Essential (primary) hypertension: Secondary | ICD-10-CM

## 2021-06-01 DIAGNOSIS — Z23 Encounter for immunization: Secondary | ICD-10-CM | POA: Diagnosis not present

## 2021-06-01 DIAGNOSIS — Z1159 Encounter for screening for other viral diseases: Secondary | ICD-10-CM

## 2021-06-01 DIAGNOSIS — R739 Hyperglycemia, unspecified: Secondary | ICD-10-CM

## 2021-06-01 MED ORDER — LORATADINE 10 MG PO TABS: 10.0000 mg | ORAL_TABLET | Freq: Every day | ORAL | 2 refills | Status: DC

## 2021-06-01 MED ORDER — AMLODIPINE BESYLATE 5 MG PO TABS: ORAL_TABLET | ORAL | 1 refills | Status: DC

## 2021-06-01 MED ORDER — FLUTICASONE PROPIONATE 50 MCG/ACT NA SUSP: 1.0000 | Freq: Every day | NASAL | 0 refills | Status: DC

## 2021-06-01 NOTE — Assessment & Plan Note (Addendum)
Patient with PMhx of mild intellectual disability and HTN presents with history of one-time syncopal event in June 2022.  His sister, who is his primary caregiver, states that she was called by his work in June because he "passed out".  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  Patient states that he does not remember being dizzy, having chest pain or when he fell down, but he did remember that it was hot that day.  He was picked up by his sister that day and returned the following day to work.  Since then, both sisters have tried to increase his water intake and he has had no further events. Unclear if patient fully lost consciousness, EKG in 2016 showed Normal sinus rhythm, Incomplete right bundle branch block, Left anterior fascicular block, unchanged from previous EKG. No hx of MI.    -repeat EKG at next visit, may consider echo as well for structural heart changes.  Physical exam was reassuring with no signs of increased volume.  No further episodes of syncope.

## 2021-06-01 NOTE — Progress Notes (Signed)
   CC: follow-up for hypertension, seasonal allergies, and syncopal event  HPI:  [name] is a 45 y.o. with medical history as below presenting to [facility] for follow-up for hypertension and seasonal allergies.  At the end of visit, his sister mentioned that he had a syncopal event in June while at work.  Please see syncope in problem-based list for further details.  Please see problem-based list for further details, assessments, and plans.  Past Medical History:  Diagnosis Date   Cellulitis    right leg   Family history of anesthesia complication    grandmother had hard time waking   Fatigue 06/2010   TSH low normal, Hb wnl, CMP wnl   GERD (gastroesophageal reflux disease)    Groin pain    Chronic. S/p R LN aspiration culture which was negative in 2009.   Hemorrhoid    Inguinal lymphadenopathy    Mental retardation, mild (I.Q. 50-70)    Myalgia    intermittent lower extremity   PEPTIC ULCER DISEASE 10/25/2006   Qualifier: Diagnosis of  By: End MD, Cristal Deer     PUD (peptic ulcer disease)    with h/o H.pylori infection   Seizures (HCC)    not on medication. h/o grand mal seizures, last episode in 1995    Review of Systems:   Constitutional: denies weight change, dizziness, or fatigue Gastrointestinal: denies diarrhea, nausea, or constipation Cardiovascular: denies chest pain, palpitations, or dyspnea with exertion  Physical Exam:  Vitals:   06/01/21 0943  BP: 136/73  Pulse: (!) 55  Temp: 97.7 F (36.5 C)  TempSrc: Oral  SpO2: 100%  Weight: 134 lb 8 oz (61 kg)    General: well-developed, well-nourished HENT: NCAT, poor dentition Eyes: no scleral icterus, conjunctiva clear CV: no m,r,g. Normal rate Pulm: CTAB, pulmonary effort normal GI:no tenderness, bowel sounds present MSK: ROM intact, peripheral pulses intact, no pitting edema in lower extremities bilaterally Skin: warm and dry, normal turgor noted Neuro: Alert and oriented Psych: normal mood and  affect, portions of history provided by sisters  Assessment & Plan:   See Encounters Tab for problem based charting.  Patient seen with Dr. Criselda Peaches

## 2021-06-01 NOTE — Patient Instructions (Addendum)
Robert Merritt, it was a pleasure seeing you today!  Today we discussed: Blood pressure- please continue taking you Amlodipine for your blood pressure.  I've sent in your medications to the [facility] pharmacy.  Allergies- continue taking flonase and claritin for your allergies.  Tetanus vaccine- You were given a tetanus shot today. This will prevent a disease called tetanus that can be caused if you accidentally get cut on metal.  You need this shot once every 10 years.  We also completed some bloodwork to check your cholesterol and blood sugar, as well as a one-time recommended screening for hepatitis C.   I have ordered the following labs today:  Lab Orders  No laboratory test(s) ordered today     Tests ordered today:  BMP, Hepatitis C, A1c, and lipid profile  Referrals ordered today:   Referral Orders  No referral(s) requested today     I have ordered the following medication/changed the following medications:   Stop the following medications: There are no discontinued medications.   Start the following medications: No orders of the defined types were placed in this encounter.    Follow-up: 6 months   Please make sure to arrive 15 minutes prior to your next appointment. If you arrive late, you may be asked to reschedule.   We look forward to seeing you next time. Please call our clinic at [phone] if you have any questions or concerns. The best time to call is Monday-Friday from 9am-4pm, but there is someone available 24/7. If after hours or the weekend, call the main hospital number and ask for the Internal Medicine Resident On-Call. If you need medication refills, please notify your pharmacy one week in advance and they will send [place] a request.  Thank you for letting us take part in your care. Wishing you the best!  Thank you, Dr. [facility]

## 2021-06-01 NOTE — Assessment & Plan Note (Signed)
Patient presents with a history of some episodes of hyperglycemia in past.  No a1c found on chart review.  Glucose on BMP in 2015 was 157. Last lipid panel in 2014 with LDL of 107, will screen today and calculate ASCVD risk. PLAN: -A1c today -BMP -lipid panel

## 2021-06-01 NOTE — Assessment & Plan Note (Addendum)
Assessment: Patient presents today for further evaluation and management of his HTN.   His blood pressure is controlled on the following medications:  1. Amlodipine 5 mg a. Patient was decreased from amlodipine 10mg  in 06/2020 due to peripheral edema.  Symptoms resolved with dose reduction  He admits to being adherent with his antihypertensive medications.   Checking BP at home: Yes , but can't remember exact numbers.  Recent blood pressure are listed below:  Blood Pressure 06/01/2021 11/26/2020 06/30/2020 06/09/2020  BP 136/73 122/84 141/96 141/88  Some recent data might be hidden    Current metabolic panels:  BMP Latest Ref Rng & Units 06/30/2020 02/27/2020 04/13/2018  Glucose 65 - 99 mg/dL 91 78 80  BUN 6 - 24 mg/dL 10 7 9   Creatinine 0.76 - 1.27 mg/dL 04/15/2018 5.63  BUN/Creat Ratio 9 - 20 11 8(L) 10  Sodium 134 - 144 mmol/L 139 139 145(H)  Potassium 3.5 - 5.2 mmol/L 4.0 4.2 4.0  Chloride 96 - 106 mmol/L 102 102 106  CO2 20 - 29 mmol/L 22 22 21   Calcium 8.7 - 10.2 mg/dL 8.9 9.2 9.0     Plan: 1. Continue amlodipine 5 mg 2. Goal BP: <130/80 3. BMP

## 2021-06-01 NOTE — Assessment & Plan Note (Addendum)
Patient endorses continued symptoms of allergies including rhinitis.  He take claritin and flonase daily and states that these help with the runny nose.  Denies congestion or sore throat. PLAN: Continue using claritin and flonase

## 2021-06-01 NOTE — Assessment & Plan Note (Signed)
PLAN: Received TDap today Hepatitis C ordered

## 2021-06-02 DIAGNOSIS — R7989 Other specified abnormal findings of blood chemistry: Secondary | ICD-10-CM | POA: Insufficient documentation

## 2021-06-02 DIAGNOSIS — R7303 Prediabetes: Secondary | ICD-10-CM | POA: Insufficient documentation

## 2021-06-02 LAB — BMP8+ANION GAP
Anion Gap: 12 mmol/L (ref 10.0–18.0)
BUN/Creatinine Ratio: 13 (ref 9–20)
BUN: 12 mg/dL (ref 6–24)
CO2: 24 mmol/L (ref 20–29)
Calcium: 9.6 mg/dL (ref 8.7–10.2)
Chloride: 104 mmol/L (ref 96–106)
Creatinine, Ser: 0.94 mg/dL (ref 0.76–1.27)
Glucose: 78 mg/dL (ref 65–99)
Potassium: 4.3 mmol/L (ref 3.5–5.2)
Sodium: 140 mmol/L (ref 134–144)
eGFR: 101 mL/min/{1.73_m2} (ref >59)

## 2021-06-02 LAB — HEMOGLOBIN A1C
Est. average glucose Bld gHb Est-mCnc: 123 mg/dL
Hgb A1c MFr Bld: 5.9 % — ABNORMAL HIGH (ref 4.8–5.6)

## 2021-06-02 LAB — LIPID PANEL
Chol/HDL Ratio: 2.5 ratio (ref 0.0–5.0)
Cholesterol, Total: 194 mg/dL (ref 100–199)
HDL: 77 mg/dL (ref >39)
LDL Chol Calc (NIH): 106 mg/dL — ABNORMAL HIGH (ref 0–99)
Triglycerides: 58 mg/dL (ref 0–149)
VLDL Cholesterol Cal: 11 mg/dL (ref 5–40)

## 2021-06-02 LAB — HEPATITIS C ANTIBODY: Hep C Virus Ab: <0.1 s/co ratio (ref 0.0–0.9)

## 2021-06-02 NOTE — Assessment & Plan Note (Addendum)
Patient with history of hyperglycemia, screened with lipid panel, results below. Lipid Panel     Component Value Date/Time   CHOL 194 06/01/2021 1033   TRIG 58 06/01/2021 1033   HDL 77 06/01/2021 1033   CHOLHDL 2.5 06/01/2021 1033   CHOLHDL 2.7 12/18/2012 1059   VLDL 9 12/18/2012 1059   LDLCALC 106 (H) 06/01/2021 1033   LABVLDL 11 06/01/2021 1033   Plan: ASCVD 7.6% This places patient at intermediate risk for atherosclerotic cardiovascular disease- will call patient's sister with results and discuss lifestyle modifications vs medications for primary prevention.  At this time will encourage lifestyle change before starting additional medications. -repeat lipid panel in 6 months

## 2021-06-02 NOTE — Assessment & Plan Note (Signed)
Patient presents with a history of episodes of hyperglycemia in past.  No a1c found on chart review. Glucose on BMP in 2015 was 157. A1c 06/02/21 was 5.9, which places this patient in the pre-diabetic range. Called and spoke with primary caregiver, patients sister, about results.  Discussed that prediabetes means patient is at high risk of developing DM.  Educated on lifestyle modifications that could prevent that from happening including: increasing physical activity with 30-60 minutes of moderate-intensity aerobic activity on most days of the week with goal of at least 150 minutes of moderate-intensity aerobic exercise per week, dietary modifications which are the DASH or Mediterranean-style diet, and behavior modification. His BMI is 21, did not encourage weight loss. PLAN: -repeat A1c in one year

## 2021-06-18 NOTE — Progress Notes (Signed)
Internal Medicine Clinic Attending  I saw and evaluated the patient.  I personally confirmed the key portions of the history and exam documented by Dr. Masters and I reviewed pertinent patient test results.  The assessment, diagnosis, and plan were formulated together and I agree with the documentation in the resident's note.  

## 2021-07-25 DIAGNOSIS — Z23 Encounter for immunization: Secondary | ICD-10-CM | POA: Diagnosis not present

## 2021-09-02 ENCOUNTER — Telehealth: Payer: Self-pay | Admitting: Internal Medicine

## 2021-09-02 ENCOUNTER — Ambulatory Visit (INDEPENDENT_AMBULATORY_CARE_PROVIDER_SITE_OTHER): Payer: Medicare Other | Admitting: Internal Medicine

## 2021-09-02 ENCOUNTER — Encounter: Payer: Self-pay | Admitting: Internal Medicine

## 2021-09-02 ENCOUNTER — Other Ambulatory Visit: Payer: Self-pay

## 2021-09-02 DIAGNOSIS — I10 Essential (primary) hypertension: Secondary | ICD-10-CM

## 2021-09-02 DIAGNOSIS — J301 Allergic rhinitis due to pollen: Secondary | ICD-10-CM

## 2021-09-02 MED ORDER — LORATADINE 10 MG PO TABS: 10.0000 mg | ORAL_TABLET | Freq: Every day | ORAL | 2 refills | Status: DC

## 2021-09-02 MED ORDER — AMLODIPINE BESYLATE 5 MG PO TABS: ORAL_TABLET | ORAL | 2 refills | Status: DC

## 2021-09-02 NOTE — Patient Instructions (Signed)
Robert Merritt, it was a pleasure seeing you today!  Today we discussed: Cough and sinus congestion Please keep using nasal spray daily.  I would also like you to start taking Claritin every day to help with allergy symptoms.  Please continue taking Robitussin, cough drops, and drinking hot tea as needed.  Blood pressure Your blood pressure looked great today.  I sent refills for 6 months for Amlodipine.  You can follow-up in the clinic in about 6 months. Please call the clinic to make appointment.  I have ordered the following labs today:  Lab Orders  No laboratory test(s) ordered today      Referrals ordered today:   Referral Orders  No referral(s) requested today     I have ordered the following medication/changed the following medications:   Stop the following medications: There are no discontinued medications.   Start the following medications: No orders of the defined types were placed in this encounter.    Follow-up: 6 months   Please make sure to arrive 15 minutes prior to your next appointment. If you arrive late, you may be asked to reschedule.   We look forward to seeing you next time. Please call our clinic at [phone] if you have any questions or concerns. The best time to call is Monday-Friday from 9am-4pm, but there is someone available 24/7. If after hours or the weekend, call the main hospital number and ask for the Internal Medicine Resident On-Call. If you need medication refills, please notify your pharmacy one week in advance and they will send [place] a request.  Thank you for letting us take part in your care. Wishing you the best!  Thank you, Dr. [facility]

## 2021-09-02 NOTE — Telephone Encounter (Signed)
Return call to pt's sister - she stated she was unable to come with pt this am; requesting a call back from the doctor of what was done at the [facility]. Thanks

## 2021-09-02 NOTE — Telephone Encounter (Signed)
Pt's sister [name]) requesting a call back.

## 2021-09-02 NOTE — Progress Notes (Signed)
Subjective:  CC: runny nose and cough  HPI:  [name] is a 45 y.o. male with a past medical history stated below and presents today for runny nose and cough that have been present for last three weeks. Please see problem based assessment and plan for additional details.  Past Medical History:  Diagnosis Date   Cellulitis    right leg   Family history of anesthesia complication    grandmother had hard time waking   Fatigue 06/2010   TSH low normal, Hb wnl, CMP wnl   GERD (gastroesophageal reflux disease)    Groin pain    Chronic. S/p R LN aspiration culture which was negative in 2009.   Hemorrhoid    Inguinal lymphadenopathy    Mental retardation, mild (I.Q. 50-70)    Myalgia    intermittent lower extremity   PEPTIC ULCER DISEASE 10/25/2006   Qualifier: Diagnosis of  By: End MD, Cristal Deer     PUD (peptic ulcer disease)    with h/o H.pylori infection   Seizures ([facility])    not on medication. h/o grand mal seizures, last episode in 1995    Current Outpatient Medications on File Prior to Visit  Medication Sig Dispense Refill   fluticasone (FLONASE) 50 MCG/ACT nasal spray Place 1 spray into both nostrils daily. 9.9 mL 0   No current facility-administered medications on file prior to visit.    Family History  Problem Relation Age of Onset   Sleep apnea Mother    Diabetes Maternal Grandmother    Kidney failure Maternal Grandmother    Sickle cell anemia Brother        Half brother died of complications of sickle cell anemia at age 68    Social History   Socioeconomic History   Marital status: Single    Spouse name: Not on file   Number of children: Not on file   Years of education: Not on file   Highest education level: Not on file  Occupational History   Occupation: works at [employer]  Tobacco Use   Smoking status: Never   Smokeless tobacco: Never  Substance and Sexual Activity   Alcohol use: No    Alcohol/week: 0.0 standard drinks   Drug use: No    Sexual activity: Not on file  Other Topics Concern   Not on file  Social History Narrative   Lives at home with mother.   Mild mental retardation.   A special Olympics Olympian.   Works at [employer]. 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    Denies alcohol, tobacco, or drug use.   Has never been sexually active.   Social Determinants of Health   Financial Resource Strain: Not on file  Food Insecurity: Not on file  Transportation Needs: Not on file  Physical Activity: Not on file  Stress: Not on file  Social Connections: Not on file  Intimate Partner Violence: Not on file    Review of Systems: ROS negative except for what is noted on the assessment and plan.  Objective:   Vitals:   09/02/21 1010  BP: (!) 133/96  Pulse: 80  Temp: 98.4 F (36.9 C)  TempSrc: Oral  SpO2: 100%  Weight: 132 lb 9.6 oz (60.1 kg)    Physical Exam: Gen: A&O x3 and in no apparent distress, well appearing and nourished. HEENT:    Head - normocephalic, atraumatic.    Eye - visual acuity grossly intact, conjunctiva clear, sclera non-icteric, EOM intact.  Mouth - No obvious caries or periodontal disease, no erythema present pharynx Neck: no masses or nodules, AROM intact. CV: RRR, no murmurs, S1/S2 presents  Resp: Clear to ascultation bilaterally  Abd: BS (+) x4, soft, non-tender abdomen, without hepatosplenomegaly or masses MSK: Grossly normal AROM and strength x4 extremities. Skin: good skin turgor, no rashes, unusual bruising, or prominent lesions.  Neuro: No focal deficits, grossly normal sensation and coordination.  Psych: Oriented x3 and responding appropriately. Intact memory, normal mood, judgement, affect, and insight.    Assessment & Plan:  See Encounters Tab for problem based charting.  Patient seen with Dr. Laney Pastor Anjolie Majer, D.O. [facility]  PGY-1 Pager: [phone]  Phone: [phone] Date 09/03/2021  Time 10:10 AM

## 2021-09-03 NOTE — Assessment & Plan Note (Signed)
BP Readings from Last 3 Encounters:  09/02/21 (!) 133/96  06/01/21 136/73  11/26/20 122/84   Patient's blood pressure is well-controlled while taking Amlodipine 5 mg.  He states that he takes this medication every morning and does not have side effects from it.  Plan: -refill amlodipine 5 mg

## 2021-09-03 NOTE — Assessment & Plan Note (Signed)
Robert Merritt presents with congestion and cough for last three weeks.  He states that his cough is non-productive and sinus drainage is clear.  He has been using flonase spray each night for the last two weeks.  Patient reports that he used to take claritin a few years ago, but has not been taking it recently. He has used robitussin and cough drops with improvement in coughing. He denies fever, chills, or change in appetite.  Assessment: Likely that coughing is secondary to post-nasal drip from allergies. He denies body aches and symptoms have been present for last three weeks making acute viral illness less likely.  Plan: -Continue nasal spray once daily -Start taking claritin daily -Called and spoke with patient's sister, who is his primary caregiver. Told her about possibly trying a neti pot to rinse sinuses if she thought he would be willing to do that. -Continue conservative management

## 2021-09-03 NOTE — Telephone Encounter (Signed)
Called patient's sister and left voicemail.  Will call back later today.

## 2021-09-04 NOTE — Progress Notes (Signed)
Internal Medicine Clinic Attending  I saw and evaluated the patient.  I personally confirmed the key portions of the history and exam documented by Dr. Masters and I reviewed pertinent patient test results.  The assessment, diagnosis, and plan were formulated together and I agree with the documentation in the resident's note.  

## 2021-10-20 IMAGING — CR DG FOOT COMPLETE 3+V*R*
3 series · 3 of 3 positions shown · non-contrast
Comparison: 08/20/2008.

CLINICAL DATA: Chronic RIGHT foot pain and swelling. No known
injuries.

EXAM:
RIGHT FOOT COMPLETE - 3+ VIEW

[foot ap]
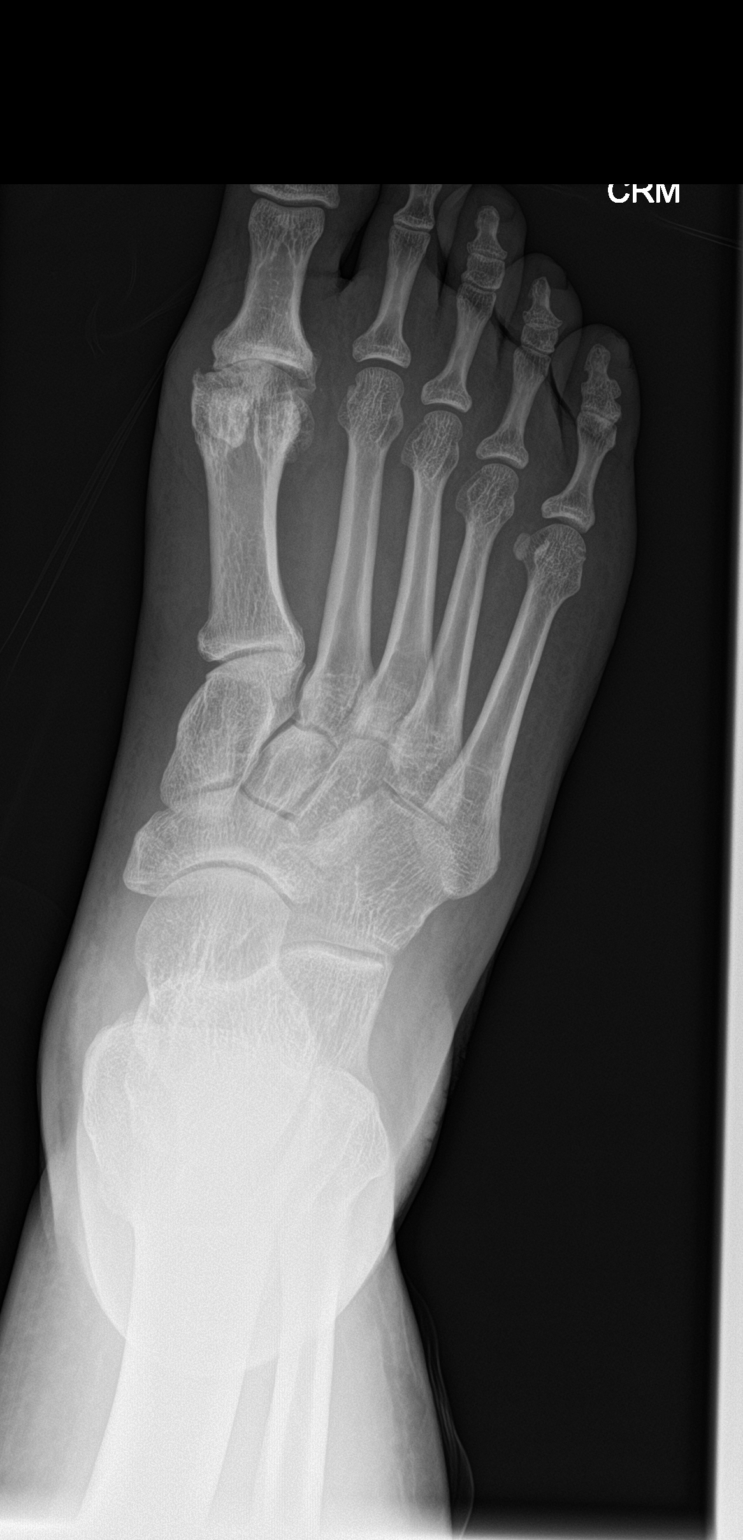

[foot obl]
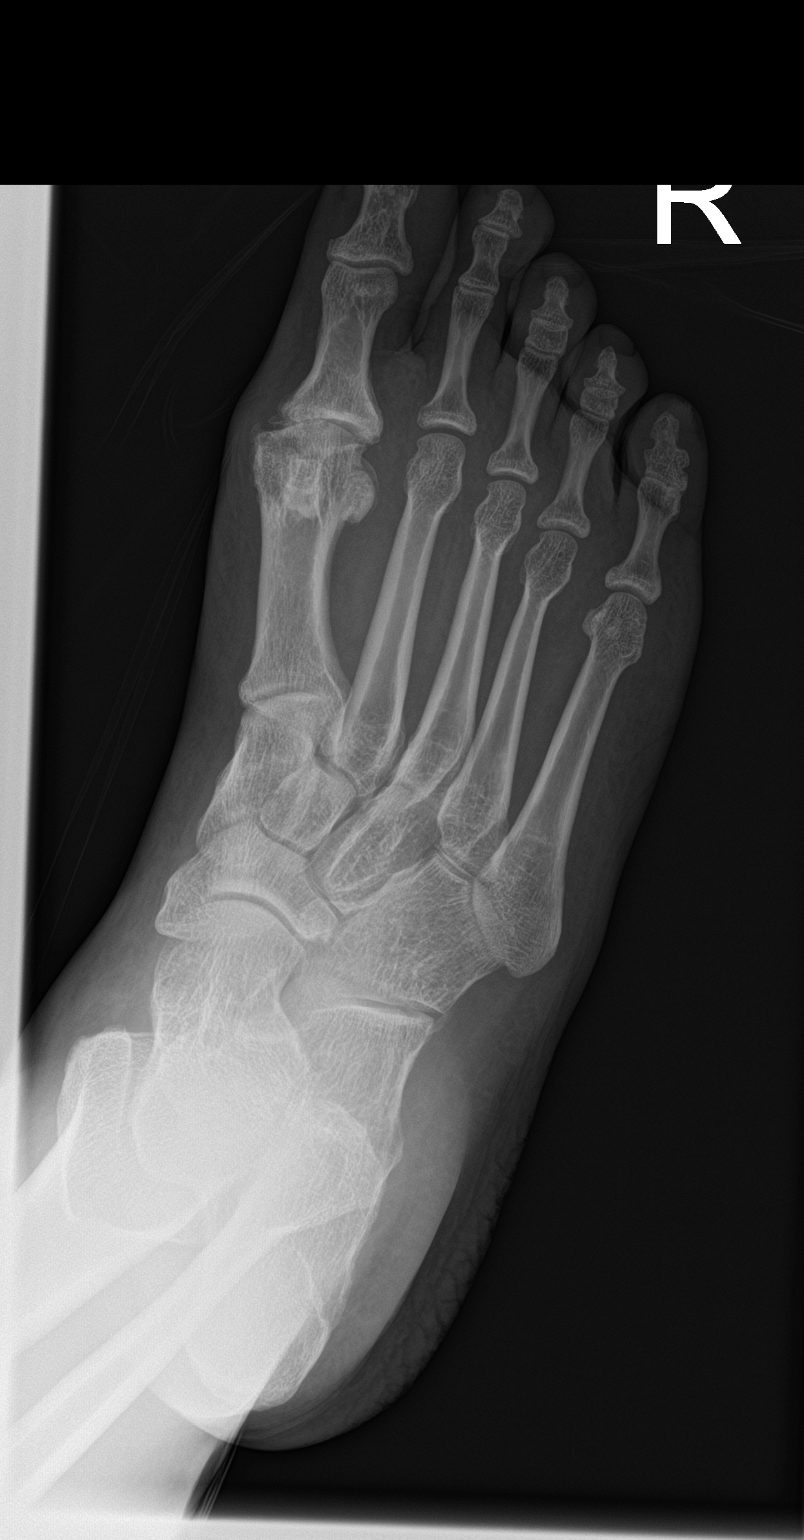

[foot lat]
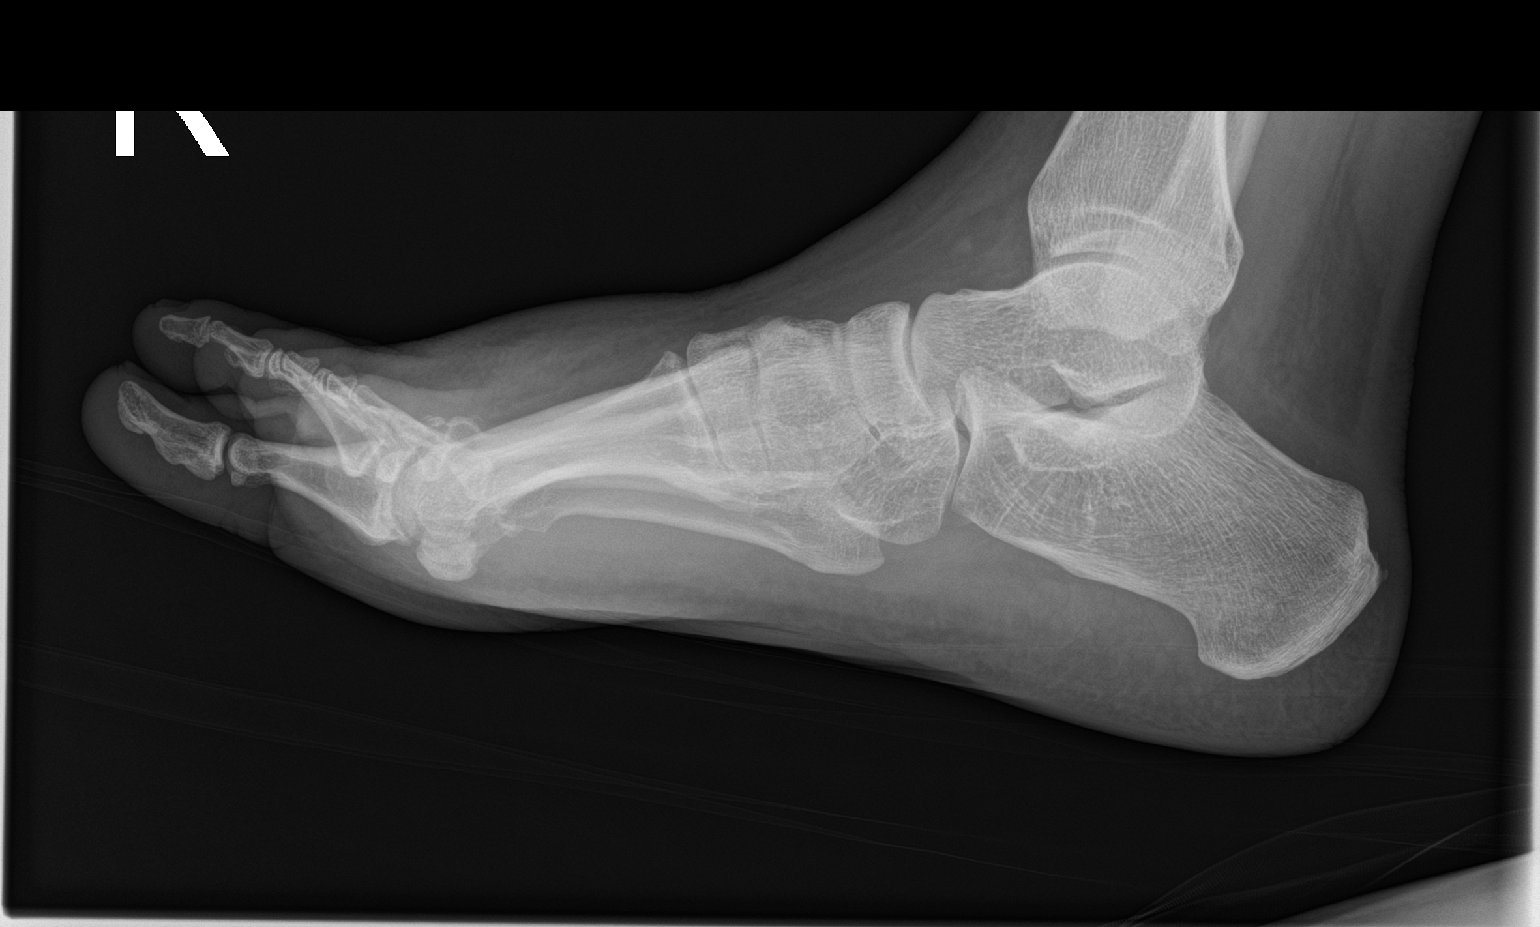

[3 of 3 positions shown; findings below may reference images not displayed]

FINDINGS: No evidence of acute, subacute or healed fractures. Complete loss of
the first MTP joint space with associated spurring, significantly
progressed since 9111. No associated erosions to confirm gout. Mild
hallux valgus. Remaining joint spaces well-preserved. Well-preserved
bone mineral density. Mild DORSAL soft tissue swelling overlying the
metatarsals.
IMPRESSION: 1. Severe osteoarthritis involving the first MTP joint. No
associated erosions to confirm gout.
2. Mild hallux valgus.
3. No acute or subacute osseous abnormality. Mild DORSAL soft tissue
swelling overlying the metatarsals.

## 2021-12-14 ENCOUNTER — Encounter: Payer: Self-pay | Admitting: *Deleted

## 2022-01-07 ENCOUNTER — Encounter: Payer: Medicare Other | Admitting: Internal Medicine

## 2022-01-14 ENCOUNTER — Encounter: Payer: Self-pay | Admitting: Internal Medicine

## 2022-01-14 ENCOUNTER — Ambulatory Visit (INDEPENDENT_AMBULATORY_CARE_PROVIDER_SITE_OTHER): Payer: Medicare Other | Admitting: Internal Medicine

## 2022-01-14 DIAGNOSIS — J301 Allergic rhinitis due to pollen: Secondary | ICD-10-CM | POA: Diagnosis not present

## 2022-01-14 DIAGNOSIS — J309 Allergic rhinitis, unspecified: Secondary | ICD-10-CM

## 2022-01-14 DIAGNOSIS — R7303 Prediabetes: Secondary | ICD-10-CM

## 2022-01-14 DIAGNOSIS — I10 Essential (primary) hypertension: Secondary | ICD-10-CM

## 2022-01-14 MED ORDER — FLUTICASONE PROPIONATE 50 MCG/ACT NA SUSP: 1.0000 | Freq: Every day | NASAL | 0 refills | Status: DC

## 2022-01-14 MED ORDER — LORATADINE 10 MG PO TABS: 10.0000 mg | ORAL_TABLET | Freq: Every day | ORAL | 2 refills | Status: DC

## 2022-01-14 NOTE — Assessment & Plan Note (Signed)
Follow-up 08/23 to recheck HgbA1c. ?

## 2022-01-14 NOTE — Assessment & Plan Note (Addendum)
Robert Merritt presents with history of allergic rhinitis. He has taken anti-histimines intermittently for last few years, but has started taking this daily since November. He states that on days that he forgets nose spray, he feels like he coughs more.  ?A: ?Symptoms appear well-controlled with claritin 10 mg and flonase spray daily.  ?P: ?Continue claritin and flonase. There is a smaller chance of systemic absorption with nasal glucocorticoid spray. HgbA1c in pre-diabetic range in 08/22. Asked patient to return 08/23 to repeat blood work. ?

## 2022-01-14 NOTE — Patient Instructions (Signed)
Mr.French L Heideman, it was a pleasure seeing you today! ? ?Today we discussed: ?Blood pressure ?Your blood pressure looked great today at 123/88. Please continue taking amlodipine 5 mg every day. You have refills of that medication at [facility] on [address]. ? ?Allergies ?I sent in refilled on claritin and flonase. I am glad to hear they are helping.  ? ?Elevated blood sugar test ?Your hgba1c (test for average blood sugar) was a little elevated in August of 2022. Please follow-up in August of 2023 to repeat blood work. ? ?I have ordered the following labs today: ? ?Lab Orders  ?No laboratory test(s) ordered today  ?  ? ?I will call if any are abnormal. All of your labs can be accessed through "My Chart" ?  ?My Chart Access: ?[email or web address] ? ?Referrals ordered today:  ? ?Referral Orders  ?No referral(s) requested today  ?  ? ?I have ordered the following medication/changed the following medications:  ? ?Stop the following medications: ?Medications Discontinued During This Encounter  ?Medication Reason  ? fluticasone (FLONASE) 50 MCG/ACT nasal spray Reorder  ? loratadine (CLARITIN) 10 MG tablet Reorder  ?  ? ?Start the following medications: ?Meds ordered this encounter  ?Medications  ? loratadine (CLARITIN) 10 MG tablet  ?  Sig: Take 1 tablet (10 mg total) by mouth daily.  ?  Dispense:  30 tablet  ?  Refill:  2  ? fluticasone (FLONASE) 50 MCG/ACT nasal spray  ?  Sig: Place 1 spray into both nostrils daily.  ?  Dispense:  9.9 mL  ?  Refill:  0  ?  ? ?Follow-up:  5 months to repeat blood work   ? ?Please make sure to arrive 15 minutes prior to your next appointment. If you arrive late, you may be asked to reschedule.  ? ?We look forward to seeing you next time. Please call our clinic at [phone] if you have any questions or concerns. The best time to call is Monday-Friday from 9am-4pm, but there is someone available 24/7. If after hours or the weekend, call the main hospital  number and ask for the Internal Medicine Resident On-Call. If you need medication refills, please notify your pharmacy one week in advance and they will send [place] a request. ? ?Thank you for letting us take part in your care. Wishing you the best! ? ?Thank you, ?Dr. Sloan Leiter ?[facility]  ?

## 2022-01-14 NOTE — Assessment & Plan Note (Signed)
BP Readings from Last 3 Encounters:  ?01/14/22 123/88  ?09/02/21 (!) 133/96  ?06/01/21 136/73  ? ?Robert Merritt presents with hypertension. He endorses taking blood pressure medication daily. His sister labeled his medication and helps him remember to take them. Blood pressure is well controlled at 123/88.  ?A/P: ?Continue amlodipine 5 mg ?

## 2022-01-14 NOTE — Progress Notes (Signed)
? ? ?Subjective:  ?CC: blood pressure ? ?HPI: ? ?[name] is a 46 y.o. male with a past medical history of hypertension, allergic rhinitis, pre-diabetes, and mild intellectual disabilities who presents today for follow-up on blood pressure. Please see problem based assessment and plan for additional details. ? ?Past Medical History:  ?Diagnosis Date  ? Cellulitis   ? right leg  ? Family history of anesthesia complication   ? grandmother had hard time waking  ? Fatigue 06/2010  ? TSH low normal, Hb wnl, CMP wnl  ? GERD (gastroesophageal reflux disease)   ? Groin pain   ? Chronic. S/p R LN aspiration culture which was negative in 2009.  ? Hemorrhoid   ? Inguinal lymphadenopathy   ? Mental retardation, mild (I.Q. 50-70)   ? Myalgia   ? intermittent lower extremity  ? PEPTIC ULCER DISEASE 10/25/2006  ? Qualifier: Diagnosis of  By: End MD, Harrell Gave    ? PUD (peptic ulcer disease)   ? with h/o H.pylori infection  ? Seizures ([place])   ? not on medication. h/o grand mal seizures, last episode in 1995  ? ? ?Current Outpatient Medications on File Prior to Visit  ?Medication Sig Dispense Refill  ? amLODipine (NORVASC) 5 MG tablet TAKE 1 TABLET(5 MG) BY MOUTH DAILY 90 tablet 2  ? ?No current facility-administered medications on file prior to visit.  ? ? ?Family History  ?Problem Relation Age of Onset  ? Sleep apnea Mother   ? Diabetes Maternal Grandmother   ? Kidney failure Maternal Grandmother   ? Sickle cell anemia Brother   ?     Half brother died of complications of sickle cell anemia at age 17  ? ? ?Social History  ? ?Socioeconomic History  ? Marital status: Single  ?  Spouse name: Not on file  ? Number of children: Not on file  ? Years of education: Not on file  ? Highest education level: Not on file  ?Occupational History  ? Occupation: works at [employer]  ?Tobacco Use  ? Smoking status: Never  ? Smokeless tobacco: Never  ?Substance and Sexual Activity  ? Alcohol use: No  ?  Alcohol/week: 0.0 standard drinks  ?  Drug use: No  ? Sexual activity: Not on file  ?Other Topics Concern  ? Not on file  ?Social History Narrative  ? Lives at home with mother.  ? Mild mental retardation.  ? A special Olympics Olympian.  ? Works at [employer]. 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   ? Denies alcohol, tobacco, or drug use.  ? Has never been sexually active.  ? ?Social Determinants of Health  ? ?Financial Resource Strain: Not on file  ?Food Insecurity: Not on file  ?Transportation Needs: Not on file  ?Physical Activity: Not on file  ?Stress: Not on file  ?Social Connections: Not on file  ?Intimate Partner Violence: Not on file  ? ? ?Review of Systems: ?ROS negative except for what is noted on the assessment and plan. ? ?Objective:  ? ?Vitals:  ? 01/14/22 0921  ?BP: 123/88  ?Pulse: 80  ?Temp: 98.4 ?F (36.9 ?C)  ?TempSrc: Oral  ?SpO2: 100%  ?Weight: 140 lb 3.2 oz (63.6 kg)  ? ? ?Physical Exam: ?Gen: A&O x3 and in no apparent distress, well appearing and nourished. ?Neck: no masses or nodules, AROM intact. ?CV: RRR, no murmurs, S1/S2 presents  ?Resp: Clear to auscultation bilaterally  ?Abd: BS (+) x4, soft, non-tender abdomen, without hepatosplenomegaly or  masses ?MSK: Grossly normal AROM and strength x4 extremities, no edema in lower extremities bilaterally ?Skin: good skin turgor, no rashes, unusual bruising, or prominent lesions.  ?Neuro: No focal deficits, grossly normal sensation and coordination.  ?Psych: Oriented x3 and responding appropriately. Intact memory, normal mood, judgement, affect, and insight.  ? ? ?Assessment & Plan:  ?Essential hypertension ?BP Readings from Last 3 Encounters:  ?01/14/22 123/88  ?09/02/21 (!) 133/96  ?06/01/21 136/73  ? ?Mr. Robert Merritt presents with hypertension. He endorses taking blood pressure medication daily. His sister labeled his medication and helps him remember to take them. Blood pressure is well controlled at 123/88.  ?A/P: ?Continue amlodipine 5 mg ? ?Allergic rhinitis ?Mr. Robert Merritt presents with history  of allergic rhinitis. He has taken anti-histimines intermittently for last few years, but has started taking this daily since November. He states that on days that he forgets nose spray, he feels like he coughs more.  ?A: ?Symptoms appear well-controlled with claritin 10 mg and flonase spray daily.  ?P: ?Continue claritin and flonase. There is a smaller chance of systemic absorption with nasal glucocorticoid spray. HgbA1c in pre-diabetic range in 08/22. Asked patient to return 08/23 to repeat blood work. ? ?Pre-diabetes ?Follow-up 08/23 to recheck HgbA1c.  ? ?Patient discussed with Dr. Daryll Drown ? ? ?Christiana Fuchs, D.O. ?[facility]  PGY-1 ?Pager: [phone]  Phone: [phone] ?Date 01/14/2022  Time 10:18 AM  ?

## 2022-02-02 NOTE — Progress Notes (Signed)
Internal Medicine Clinic Attending  Case discussed with Dr. Masters  at the time of the visit.  We reviewed the resident's history and exam and pertinent patient test results.  I agree with the assessment, diagnosis, and plan of care documented in the resident's note.  

## 2022-06-23 ENCOUNTER — Encounter: Payer: Medicare Other | Admitting: Internal Medicine

## 2022-07-05 ENCOUNTER — Encounter: Payer: Self-pay | Admitting: Internal Medicine

## 2022-07-05 ENCOUNTER — Ambulatory Visit: Payer: Medicare Other

## 2022-07-05 ENCOUNTER — Ambulatory Visit (INDEPENDENT_AMBULATORY_CARE_PROVIDER_SITE_OTHER): Payer: Medicare Other | Admitting: Internal Medicine

## 2022-07-05 VITALS — BP 134/84 | HR 72 | Temp 97.8°F | Ht 67.0 in | Wt 148.7 lb

## 2022-07-05 DIAGNOSIS — E785 Hyperlipidemia, unspecified: Secondary | ICD-10-CM

## 2022-07-05 DIAGNOSIS — Z23 Encounter for immunization: Secondary | ICD-10-CM

## 2022-07-05 DIAGNOSIS — J301 Allergic rhinitis due to pollen: Secondary | ICD-10-CM | POA: Diagnosis not present

## 2022-07-05 DIAGNOSIS — E162 Hypoglycemia, unspecified: Secondary | ICD-10-CM | POA: Diagnosis not present

## 2022-07-05 DIAGNOSIS — I10 Essential (primary) hypertension: Secondary | ICD-10-CM | POA: Diagnosis not present

## 2022-07-05 DIAGNOSIS — R7303 Prediabetes: Secondary | ICD-10-CM | POA: Diagnosis not present

## 2022-07-05 DIAGNOSIS — Z0000 Encounter for general adult medical examination without abnormal findings: Secondary | ICD-10-CM

## 2022-07-05 DIAGNOSIS — L299 Pruritus, unspecified: Secondary | ICD-10-CM | POA: Diagnosis not present

## 2022-07-05 LAB — GLUCOSE, CAPILLARY
Glucose-Capillary: 58 mg/dL — ABNORMAL LOW (ref 70–99)
Glucose-Capillary: 67 mg/dL — ABNORMAL LOW (ref 70–99)
Glucose-Capillary: 99 mg/dL (ref 70–99)

## 2022-07-05 LAB — POCT GLYCOSYLATED HEMOGLOBIN (HGB A1C): Hemoglobin A1C: 5.6 % (ref 4.0–5.6)

## 2022-07-05 MED ORDER — LORATADINE 10 MG PO TABS: 10.0000 mg | ORAL_TABLET | Freq: Every day | ORAL | 2 refills | Status: AC

## 2022-07-05 MED ORDER — AMLODIPINE BESYLATE 5 MG PO TABS: ORAL_TABLET | ORAL | 2 refills | Status: DC

## 2022-07-05 MED ORDER — FLUTICASONE PROPIONATE 50 MCG/ACT NA SUSP: 1.0000 | Freq: Every day | NASAL | 0 refills | Status: AC

## 2022-07-05 NOTE — Assessment & Plan Note (Addendum)
Patient's a1C today was stable 5.6, compared to 5.9 last year. Patient reports his sister helps cooks meals for him and he stays active by walking a lot at work.  Plan: -Repeat A1C next year

## 2022-07-05 NOTE — Progress Notes (Signed)
Subjective:   Patient ID: Robert Merritt male   DOB: Dec 11, 1975 46 y.o.   MRN: [id number]  HPI: Robert Merritt is a 46 y.o. with history significant for pre-diabetes, HTN, and allergic rhinitis who presents for routine follow up. Please see Plan for individualized problem-based charting.   Patient Active Problem List   Diagnosis Date Noted   Itchy skin 07/05/2022   Hypoglycemia 07/05/2022   High serum low-density lipoprotein (LDL) 06/02/2021   Pre-diabetes 06/02/2021   Syncope 06/01/2021   Healthcare maintenance 06/01/2021   Hyperglycemia 06/01/2021   Bilateral lower extremity edema 06/30/2020   Allergic rhinitis 01/21/2020   Essential hypertension 04/16/2014   Mild intellectual disabilities 10/25/2006   HEMORRHOIDS 10/25/2006     Current Outpatient Medications  Medication Sig Dispense Refill   amLODipine (NORVASC) 5 MG tablet TAKE 1 TABLET(5 MG) BY MOUTH DAILY 90 tablet 2   fluticasone (FLONASE) 50 MCG/ACT nasal spray Place 1 spray into both nostrils daily. 9.9 mL 0   loratadine (CLARITIN) 10 MG tablet Take 1 tablet (10 mg total) by mouth daily. 30 tablet 2   No current facility-administered medications for this visit.     Review of Systems: Review of systems is negative other than what is noted in individual problem-based charting.    Objective:   Physical Exam: Vitals:   07/05/22 1413  BP: 134/84  Pulse: 72  Temp: 97.8 F (36.6 C)  TempSrc: Oral  SpO2: 100%  Weight: 148 lb 11.2 oz (67.4 kg)  Height: 5\' 7"  (1.702 m)   Physical Exam Constitutional:      General: He is not in acute distress. Cardiovascular:     Rate and Rhythm: Normal rate and regular rhythm.     Heart sounds: No murmur heard. Pulmonary:     Effort: Pulmonary effort is normal. No respiratory distress.     Breath sounds: Normal breath sounds. No wheezing.  Abdominal:     General: Abdomen is flat. Bowel sounds are normal. There is no distension.     Palpations: Abdomen is soft.      Tenderness: There is no abdominal tenderness.  Skin:    General: Skin is warm and dry.  Neurological:     Mental Status: He is alert. Mental status is at baseline.      Assessment & Plan:   Essential hypertension Patient has chronic stable hypertension managed with amlodipine 5mg . He reports his sister helps him with his daily medication. His blood pressure was well-controlled today at 134/84.  Plan: -Refilled amlodipine 5mg   Pre-diabetes Patient's a1C today was stable 5.6, compared to 5.9 last year. Patient reports his sister helps cooks meals for him and he stays active by walking a lot at work.  Plan: -Repeat A1C next year  Itchy skin Patient reports he has itching on his face near his jawline bilaterally. He denies using any lotions, but reports he was once prescribed a cream for itchy skin. On chart review, no history of eczema or psoriasis noted and called sister, who is also unsure. No rashes, scaling, or lesions appreciated on exam.  Plan: -Recommended patient try an OTC emollient lotion for itching  Hypoglycemia Patient was found to have a glucose of 58 in clinic but remained asymptomatic. Patient reports he ate today before coming to clinic. Patient given orange juice and observed in office, glucose improved to 99.  Plan: Patient is asymptomatic, will monitor if recurs.   Healthcare maintenance Patient received flu shot today.

## 2022-07-05 NOTE — Assessment & Plan Note (Signed)
Patient was found to have a glucose of 58 in clinic but remained asymptomatic. Patient reports he ate today before coming to clinic. Patient given orange juice and observed in office, glucose improved to 99.  Plan: Patient is asymptomatic, will monitor if recurs.

## 2022-07-05 NOTE — Assessment & Plan Note (Signed)
Patient received flu shot today 

## 2022-07-05 NOTE — Assessment & Plan Note (Signed)
Patient has chronic stable hypertension managed with amlodipine 5mg . He reports his sister helps him with his daily medication. His blood pressure was well-controlled today at 134/84.  Plan: -Refilled amlodipine 5mg 

## 2022-07-05 NOTE — Patient Instructions (Addendum)
Thank you for coming to see [place] today Robert Merritt! Your blood pressure today was great at 134/84.  We have refilled your blood pressure and allergy medication. We also gave you a flu shot today in office.   We are doing some bloodwork today to look at your blood sugar, cholesterol, and kidney function and we will call you with the results.   You can try a face cream such as Eucerin or Cerave to relieve the itching. Please let us know if it does not go away or if it gets worse.

## 2022-07-05 NOTE — Assessment & Plan Note (Addendum)
Patient reports he has itching on his face near his jawline bilaterally. He denies using any lotions, but reports he was once prescribed a cream for itchy skin. On chart review, no history of eczema or psoriasis noted and called sister, who is also unsure. No rashes, scaling, or lesions appreciated on exam.  Plan: -Recommended patient try an OTC emollient lotion for itching

## 2022-07-13 NOTE — Progress Notes (Signed)
Internal Medicine Clinic Attending  Case discussed with Dr. Masters and MS3 Nelson  at the time of the visit.  We reviewed the resident's history and exam and pertinent patient test results.  I agree with the assessment, diagnosis, and plan of care documented in the resident's note.  

## 2023-04-14 ENCOUNTER — Other Ambulatory Visit: Payer: Self-pay | Admitting: Internal Medicine

## 2023-04-14 DIAGNOSIS — I10 Essential (primary) hypertension: Secondary | ICD-10-CM

## 2023-04-14 NOTE — Telephone Encounter (Signed)
Next appt scheduled  7/16 with PCP. 

## 2023-05-03 ENCOUNTER — Ambulatory Visit (INDEPENDENT_AMBULATORY_CARE_PROVIDER_SITE_OTHER): Payer: Medicare (Managed Care) | Admitting: Internal Medicine

## 2023-05-03 ENCOUNTER — Ambulatory Visit (INDEPENDENT_AMBULATORY_CARE_PROVIDER_SITE_OTHER): Payer: Medicare (Managed Care) | Admitting: *Deleted

## 2023-05-03 ENCOUNTER — Encounter: Payer: Self-pay | Admitting: *Deleted

## 2023-05-03 VITALS — BP 107/66 | HR 78 | Temp 98.1°F | Ht 67.0 in | Wt 144.8 lb

## 2023-05-03 VITALS — BP 107/66 | HR 78 | Temp 98.1°F | Wt 144.8 lb

## 2023-05-03 DIAGNOSIS — D649 Anemia, unspecified: Secondary | ICD-10-CM

## 2023-05-03 DIAGNOSIS — R7989 Other specified abnormal findings of blood chemistry: Secondary | ICD-10-CM

## 2023-05-03 DIAGNOSIS — Z0000 Encounter for general adult medical examination without abnormal findings: Secondary | ICD-10-CM

## 2023-05-03 DIAGNOSIS — R7303 Prediabetes: Secondary | ICD-10-CM

## 2023-05-03 DIAGNOSIS — I10 Essential (primary) hypertension: Secondary | ICD-10-CM | POA: Diagnosis not present

## 2023-05-03 NOTE — Progress Notes (Signed)
This is a Psychologist, occupational Note.  The care of the patient was discussed with Dr. Rudene Christians and the assessment and plan was formulated with their assistance.  Please see their note for official documentation of the patient encounter.   Subjective:   Patient ID: Robert Merritt male   DOB: 02-21-1976 47 y.o.   MRN: [id number]  HPI: RobertRobert Merritt is a 47 y.o. male with PMH of HTN, hyperglycemia and mild intellectual disability presenting to the clinic for routine follow up. He had no health concerns in this visit. He lives with his older sister Robert Merritt who helps with decision making for medical care. Please see details of the visit in assessment and plan.     Past Medical History:  Diagnosis Date   Cellulitis    right leg   Family history of anesthesia complication    grandmother had hard time waking   Fatigue 06/2010   TSH low normal, Hb wnl, CMP wnl   GERD (gastroesophageal reflux disease)    Groin pain    Chronic. S/p R LN aspiration culture which was negative in 2009.   Hemorrhoid    Inguinal lymphadenopathy    Mental retardation, mild (I.Q. 50-70)    Myalgia    intermittent lower extremity   PEPTIC ULCER DISEASE 10/25/2006   Qualifier: Diagnosis of  By: End MD, Cristal Deer     PUD (peptic ulcer disease)    with h/o H.pylori infection   Seizures (HCC)    not on medication. h/o grand mal seizures, last episode in 1995   Current Outpatient Medications  Medication Sig Dispense Refill   amLODipine (NORVASC) 5 MG tablet TAKE 1 TABLET(5 MG) BY MOUTH DAILY 90 tablet 2   fluticasone (FLONASE) 50 MCG/ACT nasal spray Place 1 spray into both nostrils daily. 9.9 mL 0   loratadine (CLARITIN) 10 MG tablet Take 1 tablet (10 mg total) by mouth daily. 30 tablet 2   No current facility-administered medications for this visit.   Family History  Problem Relation Age of Onset   Sleep apnea Mother    Sickle cell anemia Brother        Half brother died of complications of sickle  cell anemia at age 57   Diabetes Maternal Grandmother    Kidney failure Maternal Grandmother    Social History   Socioeconomic History   Marital status: Single    Spouse name: Not on file   Number of children: Not on file   Years of education: Not on file   Highest education level: Not on file  Occupational History   Occupation: works at [employer]  Tobacco Use   Smoking status: Never   Smokeless tobacco: Never  Substance and Sexual Activity   Alcohol use: No    Alcohol/week: 0.0 standard drinks of alcohol   Drug use: No   Sexual activity: Not on file  Other Topics Concern   Not on file  Social History Narrative   Lives at home with mother.   Mild mental retardation.   A special Olympics Olympian.   Works at [employer]. 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    Denies alcohol, tobacco, or drug use.   Has never been sexually active.   Social Determinants of Health   Financial Resource Strain: Not on file  Food Insecurity: Not on file  Transportation Needs: Not on file  Physical Activity: Not on file  Stress: Not on file  Social Connections: Not on file  Review of Systems: ROS negative except for what is noted on the assessment and plan.  Objective:  Physical Exam: Vitals:   05/03/23 1322  BP: 107/66  Pulse: 78  Temp: 98.1 F (36.7 C)  TempSrc: Oral  SpO2: 100%  Weight: 144 lb 12.8 oz (65.7 kg)    General appearance: alert, well-appearing Eyes: anicteric sclerae, moist conjunctivae; no lid-lag; PERRLA, tracking appropriately HENT: NCAT; oropharynx, MMM, no mucosal ulcerations; normal hard and soft palate Neck: Trachea midline; FROM, supple, no lymphadenopathy, no JVD Lungs: CTAB, no crackles, no wheeze, with normal respiratory effort and no intercostal retractions CV: RRR, S1, S2, no MRGs  Abdomen: Soft, non-tender; non-distended, BS present  Extremities: No peripheral edema, radial and DP pulses present bilaterally  Skin: Normal temperature, turgor and texture;  no rash Psych: Appropriate affect Neuro: Alert and oriented to person and place, no focal deficit   Assessment & Plan:  Essential hypertension Robert Merritt's BP in todays visit is 107/66. It is well controlled with amlodipine and he adheres to his medication. He denies any headaches today.  - Check BMP   High serum low-density lipoprotein (LDL) Robert Merritt LDL on last visit in September was 106 and he had a ASCVD score of 7.6. Previously spoke to sister about lifestyle changes vs statin. We want to take another lipid panel to decide if he needs to be on a statin. Given his ASCVD score was <10 last time, want to wait until labs come back today to decide is statins are appropriate and discuss with his sister Robert Merritt who helps manage his medical care.  - Get Lipid panel   Healthcare maintenance Due to his mild intellectual disability, Robert Merritt lives with his older sister who manages his medical care. 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 His job keeps him very active and he gets a lot of exercise through his work. He eats healthy and "loves cabbages, cornbread, green beans and meatloaf." He had no health concerns today. He denied any weakness, fatigue, SOB, rash, or any bleeding.  His last A1C was 5.3, will repeat his A1C again today and call him if it is concerning.   He was due for a colonoscopy. He has no family history of cancer. We counseled him on the importance and called his sister, Robert Merritt on her opinion about colon/rectal screening for her brother. She has done it herself before and wants to check in with his insurance to make sure it is covered. She said she will give [place] a call back. In her next call we will explore other options of FIT vs Cologuard vs colonosopy.   - Discuss more about Colon/rectal cancer screening options with Robert Merritt (sister, [phone]).   - Get A1C done  Patient discussed with Dr. Heide Spark

## 2023-05-03 NOTE — Assessment & Plan Note (Addendum)
Due to his mild intellectual disability, Mr. Leonhardt lives with his older sister who manages his medical care. 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 His job keeps him very active and he gets a lot of exercise through his work. He eats healthy and "loves cabbages, cornbread, green beans and meatloaf." He had no health concerns today. He denied any weakness, fatigue, SOB, rash, or any bleeding.  His last A1C was 5.3, will repeat his A1C again today and call him if it is concerning.   He was due for a colonoscopy. He has no family history of cancer. We counseled him on the importance and called his sister, [name] on her opinion about colon/rectal screening for her brother. She has done it herself before and wants to check in with his insurance to make sure it is covered. She said she will give [place] a call back. In her next call we will explore other options of FIT vs Cologuard vs colonosopy.   - Discuss more about Colon/rectal cancer screening options with [name] (sister, [phone]).   - Get A1C done

## 2023-05-03 NOTE — Assessment & Plan Note (Signed)
Robert Merritt's BP in todays visit is 107/66. It is well controlled with amlodipine and he adheres to his medication. He denies any headaches today.  - Check BMP

## 2023-05-03 NOTE — Patient Instructions (Addendum)
Thank you, [name] for allowing [place] to provide your care today.  I am repeating blood work for pre-diabetes, cholesterol, kidneys  and blood count today. I will call Robert Merritt with results.  Please check into pricing for colonoscopy for colon cancer screening.The other options are cologuard which would be every 3 years. I will call and talk with Robert Merritt about this as well when I review results.  I have ordered the following labs for you:  Lab Orders         Hemoglobin A1c         CBC no Diff         Lipid Profile         Basic metabolic panel      Referrals ordered today:   Referral Orders  No referral(s) requested today     I have ordered the following medication/changed the following medications:   Stop the following medications: There are no discontinued medications.   Start the following medications: No orders of the defined types were placed in this encounter.   Follow up: 6 months   We look forward to seeing you next time. Please call our clinic at [phone] if you have any questions or concerns. The best time to call is Monday-Friday from 9am-4pm, but there is someone available 24/7. If after hours or the weekend, call the main hospital number and ask for the Internal Medicine Resident On-Call. If you need medication refills, please notify your pharmacy one week in advance and they will send [place] a request.   Thank you for trusting me with your care. Wishing you the best!   Robert Christians, DO [facility]

## 2023-05-03 NOTE — Patient Instructions (Signed)
Health Maintenance, Male Adopting a healthy lifestyle and getting preventive care are important in promoting health and wellness. Ask your health care provider about: The right schedule for you to have regular tests and exams. Things you can do on your own to prevent diseases and keep yourself healthy. What should I know about diet, weight, and exercise? Eat a healthy diet  Eat a diet that includes plenty of vegetables, fruits, low-fat dairy products, and lean protein. Do not eat a lot of foods that are high in solid fats, added sugars, or sodium. Maintain a healthy weight Body mass index (BMI) is a measurement that can be used to identify possible weight problems. It estimates body fat based on height and weight. Your health care provider can help determine your BMI and help you achieve or maintain a healthy weight. Get regular exercise Get regular exercise. This is one of the most important things you can do for your health. Most adults should: Exercise for at least 150 minutes each week. The exercise should increase your heart rate and make you sweat (moderate-intensity exercise). Do strengthening exercises at least twice a week. This is in addition to the moderate-intensity exercise. Spend less time sitting. Even light physical activity can be beneficial. Watch cholesterol and blood lipids Have your blood tested for lipids and cholesterol at 49 years of age, then have this test every 5 years. You may need to have your cholesterol levels checked more often if: Your lipid or cholesterol levels are high. You are older than 49 years of age. You are at high risk for heart disease. What should I know about cancer screening? Many types of cancers can be detected early and may often be prevented. Depending on your health history and family history, you may need to have cancer screening at various ages. This may include screening for: Colorectal cancer. Prostate cancer. Skin cancer. Lung  cancer. What should I know about heart disease, diabetes, and high blood pressure? Blood pressure and heart disease High blood pressure causes heart disease and increases the risk of stroke. This is more likely to develop in people who have high blood pressure readings or are overweight. Talk with your health care provider about your target blood pressure readings. Have your blood pressure checked: Every 3-5 years if you are 18-39 years of age. Every year if you are 40 years old or older. If you are between the ages of 65 and 75 and are a current or former smoker, ask your health care provider if you should have a one-time screening for abdominal aortic aneurysm (AAA). Diabetes Have regular diabetes screenings. This checks your fasting blood sugar level. Have the screening done: Once every three years after age 45 if you are at a normal weight and have a low risk for diabetes. More often and at a younger age if you are overweight or have a high risk for diabetes. What should I know about preventing infection? Hepatitis B If you have a higher risk for hepatitis B, you should be screened for this virus. Talk with your health care provider to find out if you are at risk for hepatitis B infection. Hepatitis C Blood testing is recommended for: Everyone born from 1945 through 1965. Anyone with known risk factors for hepatitis C. Sexually transmitted infections (STIs) You should be screened each year for STIs, including gonorrhea and chlamydia, if: You are sexually active and are younger than 49 years of age. You are older than 49 years of age and your   health care provider tells you that you are at risk for this type of infection. Your sexual activity has changed since you were last screened, and you are at increased risk for chlamydia or gonorrhea. Ask your health care provider if you are at risk. Ask your health care provider about whether you are at high risk for HIV. Your health care provider  may recommend a prescription medicine to help prevent HIV infection. If you choose to take medicine to prevent HIV, you should first get tested for HIV. You should then be tested every 3 months for as long as you are taking the medicine. Follow these instructions at home: Alcohol use Do not drink alcohol if your health care provider tells you not to drink. If you drink alcohol: Limit how much you have to 0-2 drinks a day. Know how much alcohol is in your drink. In the [place] one drink equals one 12 oz bottle of beer (355 mL), one 5 oz glass of wine (148 mL), or one 1 oz glass of hard liquor (44 mL). Lifestyle Do not use any products that contain nicotine or tobacco. These products include cigarettes, chewing tobacco, and vaping devices, such as e-cigarettes. If you need help quitting, ask your health care provider. Do not use street drugs. Do not share needles. Ask your health care provider for help if you need support or information about quitting drugs. General instructions Schedule regular health, dental, and eye exams. Stay current with your vaccines. Tell your health care provider if: You often feel depressed. You have ever been abused or do not feel safe at home. Summary Adopting a healthy lifestyle and getting preventive care are important in promoting health and wellness. Follow your health care provider's instructions about healthy diet, exercising, and getting tested or screened for diseases. Follow your health care provider's instructions on monitoring your cholesterol and blood pressure. This information is not intended to replace advice given to you by your health care provider. Make sure you discuss any questions you have with your health care provider. Document Revised: 02/23/2021 Document Reviewed: 02/23/2021 Elsevier Patient Education  2024 [employer]  

## 2023-05-03 NOTE — Progress Notes (Signed)
Subjective:   Robert Merritt is a 47 y.o. male who presents for an Initial Medicare Annual Wellness Visit.  Visit Complete: In person  Patient Medicare AWV questionnaire was completed by the patient on 05/03/2023; I have confirmed that all information answered by patient is correct and no changes since this date.  Review of Systems    Defer to pcp       Objective:    Today's Vitals   05/03/23 1322  BP: 107/66  Pulse: 78  Temp: 98.1 F (36.7 C)  TempSrc: Oral  SpO2: 100%  Weight: 144 lb 13.5 oz (65.7 kg)  Height: 5\' 7"  (1.702 m)   Body mass index is 22.69 kg/m.     05/03/2023    1:42 PM 05/03/2023    1:23 PM 07/05/2022    2:13 PM 01/14/2022    9:22 AM 09/02/2021   10:15 AM 06/30/2020    8:53 AM 06/09/2020    9:44 AM  Advanced Directives  Does Patient Have a Medical Advance Directive? No No No No No No No  Would patient like information on creating a medical advance directive? No - Patient declined No - Patient declined No - Patient declined No - Patient declined No - Patient declined No - Patient declined No - Patient declined    Current Medications (verified) Outpatient Encounter Medications as of 05/03/2023  Medication Sig   amLODipine (NORVASC) 5 MG tablet TAKE 1 TABLET(5 MG) BY MOUTH DAILY   fluticasone (FLONASE) 50 MCG/ACT nasal spray Place 1 spray into both nostrils daily.   loratadine (CLARITIN) 10 MG tablet Take 1 tablet (10 mg total) by mouth daily.   No facility-administered encounter medications on file as of 05/03/2023.    Allergies (verified) Patient has no known allergies.   History: Past Medical History:  Diagnosis Date   Cellulitis    right leg   Family history of anesthesia complication    grandmother had hard time waking   Fatigue 06/2010   TSH low normal, Hb wnl, CMP wnl   GERD (gastroesophageal reflux disease)    Groin pain    Chronic. S/p R LN aspiration culture which was negative in 2009.   Hemorrhoid    Inguinal lymphadenopathy     Mental retardation, mild (I.Q. 50-70)    Myalgia    intermittent lower extremity   PEPTIC ULCER DISEASE 10/25/2006   Qualifier: Diagnosis of  By: End MD, Cristal Deer     PUD (peptic ulcer disease)    with h/o H.pylori infection   Seizures (HCC)    not on medication. h/o grand mal seizures, last episode in 1995   Past Surgical History:  Procedure Laterality Date   HERNIA REPAIR     Family History  Problem Relation Age of Onset   Sleep apnea Mother    Sickle cell anemia Brother        Half brother died of complications of sickle cell anemia at age 61   Diabetes Maternal Grandmother    Kidney failure Maternal Grandmother    Social History   Socioeconomic History   Marital status: Single    Spouse name: Not on file   Number of children: Not on file   Years of education: Not on file   Highest education level: Not on file  Occupational History   Occupation: works at [employer]  Tobacco Use   Smoking status: Never   Smokeless tobacco: Never  Substance and Sexual Activity   Alcohol use: No  Alcohol/week: 0.0 standard drinks of alcohol   Drug use: No   Sexual activity: Not on file  Other Topics Concern   Not on file  Social History Narrative   Lives at home with mother.   Mild mental retardation.   A special Olympics Olympian.   Works at [employer]. 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    Denies alcohol, tobacco, or drug use.   Has never been sexually active.   Social Determinants of Health   Financial Resource Strain: Not on file  Food Insecurity: Not on file  Transportation Needs: Not on file  Physical Activity: Not on file  Stress: Not on file  Social Connections: Not on file    Tobacco Counseling Counseling given: Not Answered   Clinical Intake:  Pre-visit preparation completed: Yes  Pain : No/denies pain     BMI - recorded: 22.69 Nutritional Status: BMI of 19-24  Normal Nutritional Risks: None Diabetes: No (prediabetes)  How often do you need to  have someone help you when you read instructions, pamphlets, or other written materials from your doctor or pharmacy?: 5 - Always ("my sister help me do it") What is the last grade level you completed in school?: "i dont know that"  Interpreter Needed?: No  Information entered by :: [email or web address]   Activities of Daily Living    05/03/2023    1:42 PM 07/05/2022    2:12 PM  In your present state of health, do you have any difficulty performing the following activities:  Hearing? 0 0  Vision? 0 0  Difficulty concentrating or making decisions? 1 0  Walking or climbing stairs? 0 0  Dressing or bathing? 0 0  Doing errands, shopping? 0 0    Patient Care Team: Masters, Florentina Addison, DO as PCP - General  Indicate any recent Medical Services you may have received from other than [facility] providers in the past year (date may be approximate).     Assessment:   This is a routine wellness examination for [place].  Hearing/Vision screen No results found.  Dietary issues and exercise activities discussed:     Goals Addressed   None   Depression Screen    07/05/2022    2:12 PM 01/14/2022    9:23 AM 09/02/2021   10:15 AM 06/09/2020    9:53 AM 02/27/2020    8:40 AM 01/21/2020   10:01 AM 04/13/2018    1:25 PM  PHQ 2/9 Scores  PHQ - 2 Score 0 0 0 0 0 0 0  PHQ- 9 Score   0        Fall Risk    05/03/2023    1:42 PM 07/05/2022    2:12 PM 01/14/2022    9:22 AM 09/02/2021   10:15 AM 06/30/2020    8:52 AM  Fall Risk   Falls in the past year? 0 0 0 0 0  Number falls in past yr: 0 0 0 0   Injury with Fall? 0 0 0 0   Risk for fall due to : No Fall Risks No Fall Risks No Fall Risks No Fall Risks No Fall Risks  Follow up Falls evaluation completed Falls evaluation completed;Falls prevention discussed Falls evaluation completed Falls evaluation completed;Falls prevention discussed Falls prevention discussed    MEDICARE RISK AT HOME:   TIMED UP AND GO:  Was the test performed? No    Cognitive  Function:        05/03/2023    1:39 PM  6CIT Screen  What Year? 0 points  What month? 0 points  What time? 0 points  Count back from 20 4 points  Months in reverse 4 points  Repeat phrase 10 points  Total Score 18 points    Immunizations Immunization History  Administered Date(s) Administered   DTP 11/13/2004   Influenza Inj Mdck Quad Pf 07/07/2016, 07/18/2019   Influenza Split 07/20/2011, 07/31/2012   Influenza Whole 09/05/2006, 08/29/2007, 08/02/2008, 09/02/2009, 07/03/2010   Influenza,inj,Quad PF,6+ Mos 07/12/2013, 07/23/2014, 07/31/2015, 07/16/2017, 08/21/2020, 07/05/2022   Influenza-Unspecified 07/02/2021   Moderna Sars-Covid-2 Vaccination 01/03/2020, 02/05/2020   Td 07/17/2010   Tdap 06/01/2021    TDAP status: Up to date  Flu Vaccine status: Up to date  Pneumococcal vaccine status: Up to date  Covid-19 vaccine status: Completed vaccines  Qualifies for Shingles Vaccine? No   Zostavax completed No   Shingrix Completed?: No.    Education has been provided regarding the importance of this vaccine. Patient has been advised to call insurance company to determine out of pocket expense if they have not yet received this vaccine. Advised may also receive vaccine at local pharmacy or Health Dept. Verbalized acceptance and understanding.  Screening Tests Health Maintenance  Topic Date Due   Colonoscopy  Never done   COVID-19 Vaccine (3 - 2023-24 season) 06/18/2022   INFLUENZA VACCINE  05/19/2023   Medicare Annual Wellness (AWV)  05/02/2024   DTaP/Tdap/Td (4 - Td or Tdap) 06/02/2031   Hepatitis C Screening  Completed   HIV Screening  Completed   HPV VACCINES  Aged Out    Health Maintenance  Health Maintenance Due  Topic Date Due   Colonoscopy  Never done   COVID-19 Vaccine (3 - 2023-24 season) 06/18/2022    Colorectal cancer screening: none (declined)  Lung Cancer Screening: (Low Dose CT Chest recommended if Age 44-80 years, 20 pack-year currently smoking OR  have quit w/in 15years.) does not qualify.   Lung Cancer Screening Referral: n/a  Additional Screening:  Hepatitis C Screening: does not qualify; Completed 06/01/2021  Vision Screening: Recommended annual ophthalmology exams for early detection of glaucoma and other disorders of the eye. Is the patient up to date with their annual eye exam?  Yes  Who is the provider or what is the name of the office in which the patient attends annual eye exams? Was Nile Riggs (retired) due to see one in August  If pt is not established with a provider, would they like to be referred to a provider to establish care? No .   Dental Screening: Recommended annual dental exams for proper oral hygiene  Diabetic Foot Exam: Diabetic Foot Exam: n/a  Community Resource Referral / Chronic Care Management: CRR required this visit?  No   CCM required this visit?  No    Plan:     I have personally reviewed and noted the following in the patient's chart:   Medical and social history Use of alcohol, tobacco or illicit drugs  Current medications and supplements including opioid prescriptions. Patient is not currently taking opioid prescriptions. Functional ability and status Nutritional status Physical activity Advanced directives List of other physicians Hospitalizations, surgeries, and ER visits in previous 12 months Vitals Screenings to include cognitive, depression, and falls Referrals and appointments  In addition, I have reviewed and discussed with patient certain preventive protocols, quality metrics, and best practice recommendations. A written personalized care plan for preventive services as well as general preventive health recommendations were provided to patient.     Joslyn Devon,  CMA   05/03/2023   After Visit Summary: (Mail) the after visit summary with patients personalized plan was offered to patient via mail   Nurse Notes: face to face  Mr. Mclester , Thank you for taking  time to come for your Medicare Wellness Visit. I appreciate your ongoing commitment to your health goals. Please review the following plan we discussed and let me know if I can assist you in the future.   These are the goals we discussed:  Goals      Blood Pressure < 140/90        This is a list of the screening recommended for you and due dates:  Health Maintenance  Topic Date Due   Colon Cancer Screening  Never done   COVID-19 Vaccine (3 - 2023-24 season) 06/18/2022   Flu Shot  05/19/2023   Medicare Annual Wellness Visit  05/02/2024   DTaP/Tdap/Td vaccine (4 - Td or Tdap) 06/02/2031   Hepatitis C Screening  Completed   HIV Screening  Completed   HPV Vaccine  Aged Out

## 2023-05-03 NOTE — Assessment & Plan Note (Signed)
Mr. Woessner LDL on last visit in September was 106 and he had a ASCVD score of 7.6. Previously spoke to sister about lifestyle changes vs statin. We want to take another lipid panel to decide if he needs to be on a statin. Given his ASCVD score was <10 last time, want to wait until labs come back today to decide is statins are appropriate and discuss with his sister Rivaan Kendall who helps manage his medical care.  - Get Lipid panel

## 2023-05-04 LAB — LIPID PANEL
Chol/HDL Ratio: 3 ratio (ref 0.0–5.0)
Cholesterol, Total: 204 mg/dL — ABNORMAL HIGH (ref 100–199)
HDL: 69 mg/dL (ref >39)
LDL Chol Calc (NIH): 116 mg/dL — ABNORMAL HIGH (ref 0–99)
Triglycerides: 105 mg/dL (ref 0–149)
VLDL Cholesterol Cal: 19 mg/dL (ref 5–40)

## 2023-05-04 LAB — CBC
Hematocrit: 44.7 % (ref 37.5–51.0)
Hemoglobin: 14.3 g/dL (ref 13.0–17.7)
MCH: 27.3 pg (ref 26.6–33.0)
MCHC: 32 g/dL (ref 31.5–35.7)
MCV: 85 fL (ref 79–97)
Platelets: 263 10*3/uL (ref 150–450)
RBC: 5.24 x10E6/uL (ref 4.14–5.80)
RDW: 13 % (ref 11.6–15.4)
WBC: 4.1 10*3/uL (ref 3.4–10.8)

## 2023-05-04 LAB — BASIC METABOLIC PANEL
BUN/Creatinine Ratio: 9 (ref 9–20)
BUN: 9 mg/dL (ref 6–24)
CO2: 25 mmol/L (ref 20–29)
Calcium: 10 mg/dL (ref 8.7–10.2)
Chloride: 100 mmol/L (ref 96–106)
Creatinine, Ser: 1.01 mg/dL (ref 0.76–1.27)
Glucose: 85 mg/dL (ref 70–99)
Potassium: 4.3 mmol/L (ref 3.5–5.2)
Sodium: 139 mmol/L (ref 134–144)
eGFR: 91 mL/min/{1.73_m2} (ref >59)

## 2023-05-04 LAB — HEMOGLOBIN A1C
Est. average glucose Bld gHb Est-mCnc: 126 mg/dL
Hgb A1c MFr Bld: 6 % — ABNORMAL HIGH (ref 4.8–5.6)

## 2023-05-04 NOTE — Progress Notes (Signed)
 Internal Medicine Clinic Attending  Case discussed with the resident at the time of the visit.  We reviewed the resident's history and exam and pertinent patient test results.  I agree with the assessment, diagnosis, and plan of care documented in the resident's note.  

## 2023-05-17 NOTE — Progress Notes (Signed)
I reviewed the AWV findings with the provider who conducted the visit. I was present in the office suite and immediately available to provide assistance and direction throughout the time the service was provided.  

## 2023-11-16 ENCOUNTER — Telehealth: Payer: Self-pay | Admitting: Internal Medicine

## 2023-11-16 NOTE — Progress Notes (Signed)
Note written for jury duty

## 2024-01-11 ENCOUNTER — Other Ambulatory Visit: Payer: Self-pay | Admitting: Internal Medicine

## 2024-01-11 DIAGNOSIS — I10 Essential (primary) hypertension: Secondary | ICD-10-CM

## 2024-01-11 NOTE — Telephone Encounter (Signed)
 Medication sent to pharmacy

## 2024-01-17 ENCOUNTER — Ambulatory Visit (INDEPENDENT_AMBULATORY_CARE_PROVIDER_SITE_OTHER): Payer: Medicare (Managed Care) | Admitting: Internal Medicine

## 2024-01-17 ENCOUNTER — Encounter: Payer: Self-pay | Admitting: Internal Medicine

## 2024-01-17 ENCOUNTER — Other Ambulatory Visit: Payer: Self-pay

## 2024-01-17 VITALS — BP 109/68 | HR 66 | Temp 97.7°F | Ht 67.0 in | Wt 149.5 lb

## 2024-01-17 DIAGNOSIS — F70 Mild intellectual disabilities: Secondary | ICD-10-CM

## 2024-01-17 DIAGNOSIS — J309 Allergic rhinitis, unspecified: Secondary | ICD-10-CM

## 2024-01-17 NOTE — Progress Notes (Unsigned)
 Subjective:  CC: chronic condition follow-up  HPI:  Robert Merritt is a 48 y.o. male with a past medical history of HTN, HLD, pre-diabetes and mild intellectual disability who presents today for chronic conditions and completion of transportation paperwork.   Please see problem based assessment and plan for additional details.  Past Medical History:  Diagnosis Date   Cellulitis    right leg   Family history of anesthesia complication    grandmother had hard time waking   Fatigue 06/2010   TSH low normal, Hb wnl, CMP wnl   GERD (gastroesophageal reflux disease)    Groin pain    Chronic. S/p R LN aspiration culture which was negative in 2009.   Hemorrhoid    Inguinal lymphadenopathy    Intellectual disability    Myalgia    intermittent lower extremity   PEPTIC ULCER DISEASE 10/25/2006   Qualifier: Diagnosis of  By: End MD, Christopher     Seizures (HCC)    not on medication. h/o grand mal seizures, last episode in 1995    MEDICATIONS:  Amlodipine 5 mg Fluticasone 50 nasal spray Loratadine 10 mg  Family History  Problem Relation Age of Onset   Sleep apnea Mother    Sickle cell anemia Brother        Half brother died of complications of sickle cell anemia at age 85   Diabetes Maternal Grandmother    Kidney failure Maternal Grandmother     Past Surgical History:  Procedure Laterality Date   HERNIA REPAIR       Social History   Socioeconomic History   Marital status: Single    Spouse name: Not on file   Number of children: Not on file   Years of education: Not on file   Highest education level: Not on file  Occupational History   Occupation: works at [employer]  Tobacco Use   Smoking status: Never   Smokeless tobacco: Never  Substance and Sexual Activity   Alcohol use: No    Alcohol/week: 0.0 standard drinks of alcohol   Drug use: No   Sexual activity: Not on file  Other Topics Concern   Not on file  Social History Narrative   Lives at home  with mother.   Mild mental retardation.   A special Olympics Olympian.   Works at [employer]. 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    Denies alcohol, tobacco, or drug use.   Has never been sexually active.   Social Drivers of Corporate investment banker Strain: Not on file  Food Insecurity: No Food Insecurity (01/17/2024)   Hunger Vital Sign    Worried About Running Out of Food in the Last Year: Never true    Ran Out of Food in the Last Year: Never true  Transportation Needs: No Transportation Needs (01/17/2024)   PRAPARE - Administrator, Civil Service (Medical): No    Lack of Transportation (Non-Medical): No  Physical Activity: Not on file  Stress: Not on file  Social Connections: Not on file  Intimate Partner Violence: Not At Risk (01/17/2024)   Humiliation, Afraid, Rape, and Kick questionnaire    Fear of Current or Ex-Partner: No    Emotionally Abused: No    Physically Abused: No    Sexually Abused: No    Review of Systems: ROS negative except for what is noted on the assessment and plan.  Objective:   Vitals:   01/17/24 1019  BP: 109/68  Pulse: 66  Temp: 97.7 F (36.5 C)  TempSrc: Oral  SpO2: 100%  Weight: 149 lb 8 oz (67.8 kg)  Height: 5\' 7"  (1.702 m)    Physical Exam: Constitutional: well-appearing, in no acute distress HENT: no erythema of pharynx Cardiovascular: regular rate and rhythm, no m/r/g Pulmonary/Chest: normal work of breathing on room air, lungs clear to auscultation bilaterally MSK: normal bulk and tone Neurological: alert & oriented x 3, normal gait Skin: warm and dry  Assessment & Plan:  Mild intellectual disabilities Anquan has great support from his sister [name] whom he lives with.  He has been working at [employer] for many years and enjoys his work there.  [name] helps with care coordination.  I completed transportation scat form during patient encounter.  He is not able to be on fixed best route but is able to follow instructions and  tell driver where he wants to go. I called and talked with Isaiah Serge about care gap with colon cancer screening.  He does not have any family history of colon cancer.  He is at average risk.  [name] plans to call insurance company and see if Cologuard testing is covered and will let clinic know as she would prefer to do home testing.  Let [name] know that I would be graduating.  I encourage physician taking over to engage [name] in care moving forward.   Allergic rhinitis For the last 2 weeks he has had increased sinus congestion, non productive cough and his throat feels sore in the morning.  He is currently using Flonase but he is unsure if he is taking Claritin at home.  He endorses pain with deep inspiration On physical exam he does have some redness to his pharynx likely from postnasal drip.  No sinus pressure and his lung sounds are clear. P: Continue Flonase I recommended to start Claritin if he is not taking at home or to switch to Zyrtec.  Instructions left in AVS which [name] will review   Patient discussed with Dr. Polo Riley Symphoni Helbling, D.O. [facility]  [id number] Pager: [phone]  Phone: [phone] Date 01/18/2024  Time 9:05 AM

## 2024-01-17 NOTE — Patient Instructions (Signed)
 Thank you, [name] for allowing [place] to provide your care today.  Transportation I have completed the forms for transportation.  Sinus symptoms It sounds like your allergies are acting up again. Please continue flonase and be sure that you are taking zyrtec or claritin right now. You can always switch to the other to see if it helps more as your body can get used to one medication if taking daily long term.  Pain with breathing in It sounds like you've had a cold recently which can make you sore when you breath deep due to how much you are coughing. Please continue taking robitussin do help control cough. Your symptoms should get better in the next few days. If you feel like they are not getting better then please come back to see [place].  Colon cancer screening You can do at home Cologuard screening. This is a kit that is sent to your house with instructions on how to sent in a stool sample. If there is no blood in the stool sample then you can repeat home kit every 3 years. If positive then he would need to undergo colonoscopy.  I have ordered the following medication/changed the following medications:   Stop the following medications: There are no discontinued medications.   Start the following medications: No orders of the defined types were placed in this encounter.    Follow up: 6 months for lab work  We look forward to seeing you next time. Please call our clinic at [phone] if you have any questions or concerns. The best time to call is Monday-Friday from 9am-4pm, but there is someone available 24/7. If after hours or the weekend, call the main hospital number and ask for the Internal Medicine Resident On-Call. If you need medication refills, please notify your pharmacy one week in advance and they will send [place] a request.   Thank you for trusting me with your care. Wishing you the best!   Rudene Christians, DO [facility]

## 2024-01-18 ENCOUNTER — Encounter: Payer: Self-pay | Admitting: Internal Medicine

## 2024-01-18 NOTE — Progress Notes (Signed)
 Internal Medicine Clinic Attending  Case discussed with the resident at the time of the visit.  We reviewed the resident's history and exam and pertinent patient test results.  I agree with the assessment, diagnosis, and plan of care documented in the resident's note.

## 2024-01-18 NOTE — Assessment & Plan Note (Signed)
 For the last 2 weeks he has had increased sinus congestion, non productive cough and his throat feels sore in the morning.  He is currently using Flonase but he is unsure if he is taking Claritin at home.  He endorses pain with deep inspiration On physical exam he does have some redness to his pharynx likely from postnasal drip.  No sinus pressure and his lung sounds are clear. P: Continue Flonase I recommended to start Claritin if he is not taking at home or to switch to Zyrtec.  Instructions left in AVS which [name] will review

## 2024-01-18 NOTE — Assessment & Plan Note (Signed)
 Robert Merritt has great support from his sister Robert Merritt whom he lives with.  He has been working at [employer] for many years and enjoys his work there.  Robert Merritt helps with care coordination.  I completed transportation scat form during patient encounter.  He is not able to be on fixed best route but is able to follow instructions and tell driver where he wants to go. I called and talked with Isaiah Serge about care gap with colon cancer screening.  He does not have any family history of colon cancer.  He is at average risk.  Robert Merritt plans to call insurance company and see if Cologuard testing is covered and will let clinic know as she would prefer to do home testing.  Let Robert Merritt know that I would be graduating.  I encourage physician taking over to engage Robert Merritt in care moving forward.

## 2024-04-06 ENCOUNTER — Encounter: Payer: Self-pay | Admitting: *Deleted

## 2024-09-05 ENCOUNTER — Other Ambulatory Visit: Payer: Self-pay

## 2024-09-05 ENCOUNTER — Ambulatory Visit: Payer: Medicare (Managed Care) | Admitting: Student

## 2024-09-05 VITALS — BP 125/86 | HR 82 | Temp 97.6°F | Ht 67.0 in | Wt 149.2 lb

## 2024-09-05 DIAGNOSIS — I10 Essential (primary) hypertension: Secondary | ICD-10-CM

## 2024-09-05 DIAGNOSIS — E7841 Elevated Lipoprotein(a): Secondary | ICD-10-CM

## 2024-09-05 DIAGNOSIS — R7303 Prediabetes: Secondary | ICD-10-CM

## 2024-09-05 DIAGNOSIS — R7989 Other specified abnormal findings of blood chemistry: Secondary | ICD-10-CM

## 2024-09-05 DIAGNOSIS — Z79899 Other long term (current) drug therapy: Secondary | ICD-10-CM | POA: Diagnosis not present

## 2024-09-05 DIAGNOSIS — J309 Allergic rhinitis, unspecified: Secondary | ICD-10-CM

## 2024-09-05 DIAGNOSIS — Z1211 Encounter for screening for malignant neoplasm of colon: Secondary | ICD-10-CM | POA: Insufficient documentation

## 2024-09-05 LAB — POCT GLYCOSYLATED HEMOGLOBIN (HGB A1C): HbA1c, POC (prediabetic range): 5.8 % (ref 5.7–6.4)

## 2024-09-05 LAB — GLUCOSE, CAPILLARY: Glucose-Capillary: 102 mg/dL — ABNORMAL HIGH (ref 70–99)

## 2024-09-05 MED ORDER — FLUZONE 0.5 ML IM SUSY
0.5000 mL | PREFILLED_SYRINGE | Freq: Once | INTRAMUSCULAR | 0 refills | Status: AC
  Filled 2024-09-05: qty 0.5, 1d supply, fill #0

## 2024-09-05 NOTE — Assessment & Plan Note (Signed)
 The patient's last hemoglobin A1c was 6.4% in July of last year while on lifestyle modification; it will be rechecked today.  - Hgb A1c

## 2024-09-05 NOTE — Patient Instructions (Signed)
 Dear Mr. Wager,  Thank you for allowing us  to provide your care today. During our visit, we reviewed several important aspects of your health, including your blood pressure, cholesterol, and overall healthcare maintenance.  I'm pleased to report that your blood pressure is at goal, which indicates that you are effectively managing it with your prescribed medications. Keep up the great work with your amlodipine  5 mg daily! I encourage you to continue taking this medication as directed. We also checked your cholesterol and blood sugar levels today. I'll be in touch once the lab results come back to discuss any necessary next steps. In the meantime, please continue with the lifestyle modifications we discussed--those will go a long way in supporting your health.  Additionally, I have sent a prescription to the pharmacy across from our office for your flu shot. Please stop by there to get vaccinated. Thank you again for your commitment to your health. If you have any questions or concerns, don't hesitate to reach out. I'll follow up with you once we have the lab results.  Please let us  know when you are ready to have a colonoscopy done and I will send a referral in for you.  Take care and stay well!  I have ordered the following labs for you:  Lab Orders         Lipid Profile         Basic metabolic panel with GFR         POC Hbg A1C       Tests ordered today:    Referrals ordered today:   Referral Orders         Ambulatory referral to Gastroenterology       I have ordered the following medication/changed the following medications:   Stop the following medications: There are no discontinued medications.   Start the following medications: No orders of the defined types were placed in this encounter.    Follow up: 1 year    Should you have any questions or concerns please call the internal medicine clinic at [phone].   Drue Lisa Grow MD 09/05/2024, 8:32 AM   [facility]

## 2024-09-05 NOTE — Assessment & Plan Note (Signed)
 Ambulatory referral sent to gastroenterology for colonoscopy

## 2024-09-05 NOTE — Progress Notes (Signed)
 CC: Routine follow up    HPI:  [name] is a 48 y.o. male living with a history stated below and presents today for routine office visit. Please see problem based assessment and plan for additional details.  Past Medical History:  Diagnosis Date   Cellulitis    right leg   Family history of anesthesia complication    grandmother had hard time waking   Fatigue 06/2010   TSH low normal, Hb wnl, CMP wnl   GERD (gastroesophageal reflux disease)    Groin pain    Chronic. S/p R LN aspiration culture which was negative in 2009.   Hemorrhoid    Inguinal lymphadenopathy    Intellectual disability    Myalgia    intermittent lower extremity   PEPTIC ULCER DISEASE 10/25/2006   Qualifier: Diagnosis of  By: End MD, Christopher     Seizures (HCC)    not on medication. h/o grand mal seizures, last episode in 1995    Current Outpatient Medications on File Prior to Visit  Medication Sig Dispense Refill   amLODipine  (NORVASC ) 5 MG tablet TAKE 1 TABLET(5 MG) BY MOUTH DAILY 90 tablet 2   fluticasone  (FLONASE ) 50 MCG/ACT nasal spray Place 1 spray into both nostrils daily. 9.9 mL 0   loratadine  (CLARITIN ) 10 MG tablet Take 1 tablet (10 mg total) by mouth daily. 30 tablet 2   No current facility-administered medications on file prior to visit.    Family History  Problem Relation Age of Onset   Sleep apnea Mother    Sickle cell anemia Brother        Half brother died of complications of sickle cell anemia at age 64   Diabetes Maternal Grandmother    Kidney failure Maternal Grandmother     Social History   Socioeconomic History   Marital status: Single    Spouse name: Not on file   Number of children: Not on file   Years of education: Not on file   Highest education level: Not on file  Occupational History   Occupation: works at [employer]  Tobacco Use   Smoking status: Never   Smokeless tobacco: Never  Substance and Sexual Activity   Alcohol use: No    Alcohol/week:  0.0 standard drinks of alcohol   Drug use: No   Sexual activity: Not on file  Other Topics Concern   Not on file  Social History Narrative   Lives at home with mother.   Mild mental retardation.   A special Olympics Olympian.   Works at [employer]. 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    Denies alcohol, tobacco, or drug use.   Has never been sexually active.   Social Drivers of Corporate Investment Banker Strain: Not on file  Food Insecurity: No Food Insecurity (01/17/2024)   Hunger Vital Sign    Worried About Running Out of Food in the Last Year: Never true    Ran Out of Food in the Last Year: Never true  Transportation Needs: No Transportation Needs (01/17/2024)   PRAPARE - Administrator, Civil Service (Medical): No    Lack of Transportation (Non-Medical): No  Physical Activity: Not on file  Stress: Not on file  Social Connections: Not on file  Intimate Partner Violence: Not At Risk (01/17/2024)   Humiliation, Afraid, Rape, and Kick questionnaire    Fear of Current or Ex-Partner: No    Emotionally Abused: No    Physically Abused: No  Sexually Abused: No    Review of Systems: ROS negative except for what is noted on the assessment and plan.  Vitals:   09/05/24 0813  BP: 125/86  Pulse: 82  Temp: 97.6 F (36.4 C)  TempSrc: Oral  SpO2: 98%  Weight: 149 lb 3.2 oz (67.7 kg)  Height: 5' 7 (1.702 m)    Physical Exam: Constitutional: well-appearing man, sitting in chair , in no acute distress HENT: normocephalic atraumatic, mucous membranes moist Eyes: conjunctiva non-erythematous Cardiovascular: regular rate and rhythm, no m/r/g Pulmonary/Chest: normal work of breathing on room air, lungs clear to auscultation bilaterally Abdominal: soft, non-tender, non-distended MSK: normal bulk and tone Neurological: alert & oriented x 3, no focal deficit Skin: warm and dry Psych: normal mood and behavior  Assessment & Plan:   Essential hypertension BP Readings from  Last 3 Encounters:  09/05/24 125/86  01/17/24 109/68  05/03/23 107/66  Blood pressure within goal.  Continue amlodipine  5 mg  High serum low-density lipoprotein (LDL) Rechecking his lipid profile today to ensure its not worse that may warrant medical intervention .The 10-year ASCVD risk score 7.7% - Lipid panel  Pre-diabetes The patient's last hemoglobin A1c was 6.4% in July of last year while on lifestyle modification; it will be rechecked today.  - Hgb A1c  Colon cancer screening Ambulatory referral sent to gastroenterology for colonoscopy     Patient discussed with Dr. Trudy Drue Grow, M.D [facility] Phone: [phone] Date 09/05/2024 Time 11:50 AM

## 2024-09-05 NOTE — Assessment & Plan Note (Signed)
 BP Readings from Last 3 Encounters:  09/05/24 125/86  01/17/24 109/68  05/03/23 107/66  Blood pressure within goal.  Continue amlodipine  5 mg

## 2024-09-05 NOTE — Assessment & Plan Note (Addendum)
 Rechecking his lipid profile today to ensure its not worse that may warrant medical intervention .The 10-year ASCVD risk score 7.7% - Lipid panel

## 2024-09-06 ENCOUNTER — Ambulatory Visit: Payer: Self-pay | Admitting: Student

## 2024-09-06 LAB — LIPID PANEL
Chol/HDL Ratio: 2.8 ratio (ref 0.0–5.0)
Cholesterol, Total: 190 mg/dL (ref 100–199)
HDL: 69 mg/dL (ref >39)
LDL Chol Calc (NIH): 106 mg/dL — ABNORMAL HIGH (ref 0–99)
Triglycerides: 83 mg/dL (ref 0–149)
VLDL Cholesterol Cal: 15 mg/dL (ref 5–40)

## 2024-09-06 LAB — BASIC METABOLIC PANEL WITH GFR
BUN/Creatinine Ratio: 12 (ref 9–20)
BUN: 11 mg/dL (ref 6–24)
CO2: 22 mmol/L (ref 20–29)
Calcium: 9.1 mg/dL (ref 8.7–10.2)
Chloride: 101 mmol/L (ref 96–106)
Creatinine, Ser: 0.94 mg/dL (ref 0.76–1.27)
Glucose: 87 mg/dL (ref 70–99)
Potassium: 4.3 mmol/L (ref 3.5–5.2)
Sodium: 139 mmol/L (ref 134–144)
eGFR: 99 mL/min/1.73 (ref >59)

## 2024-09-07 NOTE — Progress Notes (Signed)
 Internal Medicine Clinic Attending  Case discussed with the resident at the time of the visit.  We reviewed the resident's history and exam and pertinent patient test results.  I agree with the assessment, diagnosis, and plan of care documented in the resident's note.

## 2024-10-19 ENCOUNTER — Other Ambulatory Visit: Payer: Self-pay

## 2024-10-19 DIAGNOSIS — I10 Essential (primary) hypertension: Secondary | ICD-10-CM

## 2024-10-19 MED ORDER — AMLODIPINE BESYLATE 5 MG PO TABS: ORAL_TABLET | ORAL | 2 refills | Status: AC

## 2024-10-19 NOTE — Telephone Encounter (Signed)
 Medication sent to pharmacy

## 2024-11-19 ENCOUNTER — Encounter: Payer: Self-pay | Admitting: Gastroenterology
